# Patient Record
Sex: Male | Born: 1955 | Race: White | Hispanic: No | Marital: Married | Smoking: Never smoker
Health system: Southern US, Community
[De-identification: ages and names within clinical notes are randomized; demographics above are authoritative.]

## PROBLEM LIST (undated history)

## (undated) DIAGNOSIS — I4891 Unspecified atrial fibrillation: Secondary | ICD-10-CM

## (undated) DIAGNOSIS — I10 Essential (primary) hypertension: Secondary | ICD-10-CM

## (undated) DIAGNOSIS — E119 Type 2 diabetes mellitus without complications: Secondary | ICD-10-CM

## (undated) HISTORY — DX: Essential (primary) hypertension: I10

## (undated) HISTORY — DX: Type 2 diabetes mellitus without complications: E11.9

## (undated) HISTORY — DX: Unspecified atrial fibrillation: I48.91

---

## 1977-07-04 HISTORY — PX: OTHER SURGICAL HISTORY: SHX169

## 2012-07-05 LAB — PSA: PSA: NORMAL

## 2014-12-04 ENCOUNTER — Telehealth: Payer: Self-pay | Admitting: Internal Medicine

## 2014-12-04 NOTE — Telephone Encounter (Signed)
error 

## 2014-12-16 ENCOUNTER — Telehealth: Payer: Self-pay | Admitting: Internal Medicine

## 2014-12-16 NOTE — Telephone Encounter (Signed)
Pt scheduled for 03/26/15 3:00pm as new pt.

## 2014-12-16 NOTE — Telephone Encounter (Signed)
-----   Message from Dorette Grate, [place] sent at 12/13/2014 12:52 PM EDT ----- Regarding: FW: New Pt request Okay to schedule NP appt.   ----- Message -----    From: Wanda Plump, MD    Sent: 12/13/2014  12:49 PM      To: Dorette Grate, CMA Subject: RE: New Pt request                             That is ok JP ----- Message -----    From: Dorette Grate, CMA    Sent: 12/04/2014   1:52 PM      To: Wanda Plump, MD Subject: New Pt request                                 Please see below.   ----- Message -----    From: Maia Petties    Sent: 12/04/2014   1:42 PM      To: Dorette Grate, CMA  Pt has just moved here from [place]. He was referred to Dr. Drue Novel by an existing patient ([place], Terry W - [phone]). He states he has Media planner, currently does not have insurance thru his employer but likely will. Would Dr. Drue Novel accept him as a new patient?

## 2015-03-20 ENCOUNTER — Encounter: Payer: Self-pay | Admitting: Medical

## 2015-03-20 ENCOUNTER — Ambulatory Visit (HOSPITAL_BASED_OUTPATIENT_CLINIC_OR_DEPARTMENT_OTHER)
Admission: RE | Admit: 2015-03-20 | Discharge: 2015-03-20 | Disposition: A | Discharge status: Home / Self Care | Payer: No Typology Code available for payment source | Source: Ambulatory Visit | Attending: Medical | Admitting: Medical

## 2015-03-20 ENCOUNTER — Ambulatory Visit (INDEPENDENT_AMBULATORY_CARE_PROVIDER_SITE_OTHER): Payer: No Typology Code available for payment source | Admitting: Medical

## 2015-03-20 VITALS — BP 130/96 | HR 100 | Temp 98.0°F | Resp 16 | Ht 73.0 in | Wt 269.0 lb

## 2015-03-20 DIAGNOSIS — R030 Elevated blood-pressure reading, without diagnosis of hypertension: Secondary | ICD-10-CM

## 2015-03-20 DIAGNOSIS — I481 Persistent atrial fibrillation: Secondary | ICD-10-CM | POA: Diagnosis not present

## 2015-03-20 DIAGNOSIS — I4819 Other persistent atrial fibrillation: Secondary | ICD-10-CM

## 2015-03-20 DIAGNOSIS — J90 Pleural effusion, not elsewhere classified: Secondary | ICD-10-CM | POA: Diagnosis not present

## 2015-03-20 DIAGNOSIS — IMO0001 Reserved for inherently not codable concepts without codable children: Secondary | ICD-10-CM

## 2015-03-20 DIAGNOSIS — I4891 Unspecified atrial fibrillation: Secondary | ICD-10-CM | POA: Diagnosis not present

## 2015-03-20 DIAGNOSIS — I10 Essential (primary) hypertension: Secondary | ICD-10-CM | POA: Diagnosis present

## 2015-03-20 DIAGNOSIS — I517 Cardiomegaly: Secondary | ICD-10-CM | POA: Diagnosis not present

## 2015-03-20 MED ORDER — METOPROLOL TARTRATE 50 MG PO TABS: 50.0000 mg | ORAL_TABLET | Freq: Two times a day (BID) | ORAL | Status: DC

## 2015-03-20 NOTE — Progress Notes (Signed)
Pre visit review using our clinic review tool, if applicable. No additional management support is needed unless otherwise documented below in the visit note. 

## 2015-03-20 NOTE — Progress Notes (Signed)
Subjective:    Patient ID: Jonathon Price, male    DOB: 1956/03/07, 59 y.o.   MRN: [id number]  HPI   I have reviewed pt PMH, PSH, FH, Social History and Surgical History.  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 No exercise, Caffeinated beverage. Occasional soft drink. No alcohol, no tobacco, Married-2 children.   Atrial fibrillation- dx for 12 yrs.  Pt is only on low dose aspirin 81 mg. Pt tried warfarin in past. Vision side effects with  the medication and blood pressure elevation caused by warfarin per pt. Pt pulse is usually mid to upper 70's. Pt pulse was upper 90's yesterday. Pt former doctor was in [place]. He states that MD was cardiologist. Pt states echo done 12 years ago. At that time he states decision was only to be on aspirin. Pt states each year physical had normal labs and always had rate control.  No hx of chf, no hx of htn(except for today his bp is high), no diabetes history, and no hx of stroke or MI. Pt is 59yo. Today [place] score would be at most 1. Pt never had echo done. Nx of valve disease that he knows of. No hyperthyoid hx.   No chest pain.No sob. No leg pain.  BP has been been mid 140 systolic over mid 90 yesterday. Usually pt bp 120-130/70.   Not diabetic. No valve disease. No mi or strokes.  Pt just yesterday felt random tingle sensation. Sweaty for about 10 minutes.   Pt has had normal physical each yr per his report.   Pt pulse with pulse/ox monitor varies between 130-150. On ekg rate 135.      Review of Systems   Constitutional: Negative for fever, chills, diaphoresis, activity change and fatigue.  Respiratory: Negative for cough, chest tightness and shortness of breath.   Cardiovascular: Negative for chest pain, palpitations and leg swelling.  Gastrointestinal: Negative for nausea, vomiting and abdominal pain.  Musculoskeletal: Negative for neck pain and neck stiffness.  Neurological: Negative for dizziness,  tremors, seizures, syncope, facial asymmetry, speech difficulty, weakness, light-headedness, numbness and headaches.  Psychiatric/Behavioral: Negative for behavioral problems, confusion and agitation. The patient is not nervous/anxious.       History reviewed. No pertinent past medical history.  Social History   Social History  . Marital Status: Married    Spouse Name: N/A  . Number of Children: N/A  . Years of Education: N/A   Occupational History  . Not on file.   Social History Main Topics  . Smoking status: Never Smoker   . Smokeless tobacco: Never Used  . Alcohol Use: No  . Drug Use: No  . Sexual Activity: Not on file   Other Topics Concern  . Not on file   Social History Narrative  . No narrative on file    Past Surgical History  Procedure Laterality Date  . Right ankle operation  12 31 1978    Family History  Problem Relation Age of Onset  . Cancer Mother     Not on File  No current outpatient prescriptions on file prior to visit.   No current facility-administered medications on file prior to visit.    BP 130/96 mmHg  Pulse 100  Temp(Src) 98 F (36.7 C) (Oral)  Resp 16  Ht 6\' 1"  (1.854 m)  Wt 269 lb (122.018 kg)  BMI 35.50 kg/m2  SpO2 98%       Objective:   Physical Exam  General Mental Status- Alert.  General Appearance- Not in acute distress.   Skin General: Color- Normal Color. Moisture- Normal Moisture.  Neck Carotid Arteries- Normal color. Moisture- Normal Moisture. No carotid bruits. No JVD.  Chest and Lung Exam Auscultation: Breath Sounds:-Normal. CTA  Cardiovascular Auscultation:Rythm- irregular.  Murmurs & Other Heart Sounds:Auscultation of the heart reveals- No Murmurs.  Abdomen Inspection:-Inspeection Normal. Palpation/Percussion:Note:No mass. Palpation and Percussion of the abdomen reveal- Non Tender, Non Distended + BS, no rebound or guarding.    Neurologic Cranial Nerve exam:- CN III-XII intact(No  nystagmus), symmetric smile. Romberg Exam:- Negative.  Finger to Nose:- Normal/Intact Strength:- 5/5 equal and symmetric strength both upper and lower extremities.  Lower ext- no pedal edema. Negative homans signs.,      Assessment & Plan:  On ekg showed atrial fibrillation. Rate 135. I did talk with Dr. Theodoro Clock cardiologist  on call. Will give pt metoprolol 50 mg bid. Refer him to cardiology. When we make appointment will see if cardiologist office wants [place] to order echo or their office can do same day visit.  Will go ahead and schedule appointment with cardiologist.   Did also decide to get cxr today.  Follow up with Dr. Drue Novel next week.  Would recommend also scheduling for complete physical in near future.  If pt gets symptomatic then advised to go to ED.

## 2015-03-20 NOTE — Patient Instructions (Addendum)
I did talk with Dr. Theodoro Clock cardiologist  on call. Will give pt metoprolol 50 mg bid. Refer him to cardiology. When we make appointment will see if cardiologist office wants [place] to order echo or their office can do same day visit.  Will go ahead and schedule appointment with cardiologist.   Did also decide to get cxr today.  Follow up with Dr. Drue Novel next week.  Would recommend also scheduling for complete physical in near future.  If pt gets symptomatic then advised to go to ED.

## 2015-03-24 ENCOUNTER — Ambulatory Visit (HOSPITAL_COMMUNITY)
Admission: RE | Admit: 2015-03-24 | Discharge: 2015-03-24 | Disposition: A | Discharge status: Home / Self Care | Payer: No Typology Code available for payment source | Source: Ambulatory Visit | Attending: Nurse Practitioner | Admitting: Nurse Practitioner

## 2015-03-24 VITALS — BP 152/82 | HR 101 | Ht 73.0 in | Wt 271.2 lb

## 2015-03-24 DIAGNOSIS — Z79899 Other long term (current) drug therapy: Secondary | ICD-10-CM | POA: Insufficient documentation

## 2015-03-24 DIAGNOSIS — I4819 Other persistent atrial fibrillation: Secondary | ICD-10-CM

## 2015-03-24 DIAGNOSIS — Z7982 Long term (current) use of aspirin: Secondary | ICD-10-CM | POA: Insufficient documentation

## 2015-03-24 DIAGNOSIS — I4891 Unspecified atrial fibrillation: Secondary | ICD-10-CM | POA: Diagnosis present

## 2015-03-24 DIAGNOSIS — R0683 Snoring: Secondary | ICD-10-CM | POA: Insufficient documentation

## 2015-03-24 DIAGNOSIS — I481 Persistent atrial fibrillation: Secondary | ICD-10-CM | POA: Diagnosis not present

## 2015-03-24 MED ORDER — DILTIAZEM HCL ER COATED BEADS 180 MG PO CP24: 180.0000 mg | ORAL_CAPSULE | Freq: Every day | ORAL | Status: DC

## 2015-03-24 NOTE — Patient Instructions (Signed)
Your physician has recommended you make the following change in your medication:  1)Stop metoprolol 2)Start cardizem  once a day  Jonathon Price will call to schedule echo  Scheduler will call regarding scheduling sleep study  Parking code [phone]

## 2015-03-25 ENCOUNTER — Ambulatory Visit (HOSPITAL_COMMUNITY)
Admission: RE | Admit: 2015-03-25 | Discharge: 2015-03-25 | Disposition: A | Discharge status: Home / Self Care | Payer: No Typology Code available for payment source | Source: Ambulatory Visit | Attending: Nurse Practitioner | Admitting: Nurse Practitioner

## 2015-03-25 ENCOUNTER — Telehealth: Payer: Self-pay | Admitting: Behavioral Health

## 2015-03-25 ENCOUNTER — Encounter: Payer: Self-pay | Admitting: Behavioral Health

## 2015-03-25 DIAGNOSIS — I351 Nonrheumatic aortic (valve) insufficiency: Secondary | ICD-10-CM | POA: Diagnosis not present

## 2015-03-25 DIAGNOSIS — I4819 Other persistent atrial fibrillation: Secondary | ICD-10-CM

## 2015-03-25 DIAGNOSIS — I4891 Unspecified atrial fibrillation: Secondary | ICD-10-CM | POA: Insufficient documentation

## 2015-03-25 DIAGNOSIS — I340 Nonrheumatic mitral (valve) insufficiency: Secondary | ICD-10-CM | POA: Insufficient documentation

## 2015-03-25 DIAGNOSIS — I481 Persistent atrial fibrillation: Secondary | ICD-10-CM | POA: Diagnosis not present

## 2015-03-25 DIAGNOSIS — I517 Cardiomegaly: Secondary | ICD-10-CM | POA: Insufficient documentation

## 2015-03-25 NOTE — Progress Notes (Signed)
  Echocardiogram 2D Echocardiogram has been performed.  Jonathon Price 03/25/2015, 9:55 AM

## 2015-03-25 NOTE — Telephone Encounter (Signed)
Pre-Visit Call completed with patient and chart updated.   Pre-Visit Info documented in Specialty Comments under [employer].    

## 2015-03-25 NOTE — Progress Notes (Signed)
Patient ID: Jonathon Price, male   DOB: [birth year], 59 y.o.   MRN: [id number]     Primary Care Physician: Willow Ora, MD Referring Physician:same   Jonathon Price is a 59 y.o. male with a h/o PAF x 12 years that recently has been found to be persistent being evaluated in the afib clinic. He was first dx in [place] and was followed by a cardiologist there. He states he was on coumadin at one time but stopped due to side effects. He currently has a chadsvasc score of 0 and is on a baby asa. He started having some elevation in BP and sought medical attention and was found to be in afib. In the office today, he is not aware of any change of being in afib, other than the fluctuations in BP. Denies fatigue, shortness of breath, palpitations. He currently is out of work but actively looking for a job.  He denies any alcohol use, caffeine use. He does snore with apneic spells per wife but pt denies symptoms of sleep apnea. He does not exercise and is overweight. He was placed on  metoprolol a few days ago for rate control, HR now 101 down from 135 bpm, but would like to change drug due to excessive gas.  Today, he denies symptoms of palpitations, chest pain, shortness of breath, orthopnea, PND, lower extremity edema, dizziness, presyncope, syncope, or neurologic sequela. The patient is tolerating medications without difficulties and is otherwise without complaint today.   No past medical history on file. Past Surgical History  Procedure Laterality Date  . Right ankle operation  12 31 1978    Current Outpatient Prescriptions  Medication Sig Dispense Refill  . aspirin 81 MG tablet Take 81 mg by mouth daily.    Marland Kitchen diltiazem (CARDIZEM CD) 180 MG 24 hr capsule Take 1 capsule (180 mg total) by mouth daily. 30 capsule 3   No current facility-administered medications for this encounter.    No Known Allergies  Social History   Social History  . Marital Status: Married    Spouse Name: N/A  .  Number of Children: N/A  . Years of Education: N/A   Occupational History  . Not on file.   Social History Main Topics  . Smoking status: Never Smoker   . Smokeless tobacco: Never Used  . Alcohol Use: No  . Drug Use: No  . Sexual Activity: Yes   Other Topics Concern  . Not on file   Social History Narrative  . No narrative on file    Family History  Problem Relation Age of Onset  . Cancer Mother     ROS- All systems are reviewed and negative except as per the HPI above  Physical Exam: Filed Vitals:   03/24/15 0844  BP: 152/82  Pulse: 101  Height:  (1.854 m)  Weight: 271 lb 3.2 oz (123.016 kg)    GEN- The patient is well appearing, alert and oriented x 3 today.   Head- normocephalic, atraumatic Eyes-  Sclera clear, conjunctiva pink Ears- hearing intact Oropharynx- clear Neck- supple, no JVP Lymph- no cervical lymphadenopathy Lungs- Clear to ausculation bilaterally, normal work of breathing Heart- Irregular rate and rhythm, no murmurs, rubs or gallops, PMI not laterally displaced GI- soft, NT, ND, + BS Extremities- no clubbing, cyanosis, or edema MS- no significant deformity or atrophy Skin- no rash or lesion Psych- euthymic mood, full affect Neuro- strength and sensation are intact  EKG- Afib with rvr at 1010 bpm,  QRS int 90 ms, QTc 433 ms  Assessment and Plan: 1. Afib Sounds like may have been PAF in past but now has asymptomatic persistent afib Will change metoprolol due to intolerance and start cardizem  a day Continue asa for now with chadsvasc score of 0-1( recent elevation of BP, no previous dx) Pt understands if we pursue SR he may have to be on an anticoagulant short term Schedule echo  2. Snoring Schedule sleep study  3. Lifestyle factors Weight loss/regular exercise encouraged  F/u 1-2 weeks for effect of cardizem on rate control/BP  Donna C. [facility] [address] [place], [place]  [address] [phone]

## 2015-03-26 ENCOUNTER — Ambulatory Visit (INDEPENDENT_AMBULATORY_CARE_PROVIDER_SITE_OTHER): Payer: No Typology Code available for payment source | Admitting: Internal Medicine

## 2015-03-26 ENCOUNTER — Encounter: Payer: Self-pay | Admitting: Internal Medicine

## 2015-03-26 VITALS — BP 142/84 | HR 64 | Temp 98.1°F | Ht 73.0 in | Wt 273.1 lb

## 2015-03-26 DIAGNOSIS — I4819 Other persistent atrial fibrillation: Secondary | ICD-10-CM

## 2015-03-26 DIAGNOSIS — Z09 Encounter for follow-up examination after completed treatment for conditions other than malignant neoplasm: Secondary | ICD-10-CM

## 2015-03-26 DIAGNOSIS — I481 Persistent atrial fibrillation: Secondary | ICD-10-CM

## 2015-03-26 DIAGNOSIS — IMO0001 Reserved for inherently not codable concepts without codable children: Secondary | ICD-10-CM

## 2015-03-26 DIAGNOSIS — R030 Elevated blood-pressure reading, without diagnosis of hypertension: Secondary | ICD-10-CM | POA: Diagnosis not present

## 2015-03-26 NOTE — Progress Notes (Signed)
Subjective:    Patient ID: Jonathon Price, male    DOB: [birth year], 59 y.o.   MRN: [id number]  DOS:  03/26/2015 Type of visit - description : New patient to me, to get established Interval history: Atrial fibrillation: Just saw cardiology had an echocardiogram. He was intolerant to beta blockers, now on CCBs and feeling well. Elevated BP: Has check his BP for years, has always been in the 120/70, in the last 2 weeks it has been ~ 140/85  . Snoring: Has always been an issue, cardiology is already referring him for a sleep study   Review of Systems Denies chest pain, difficulty breathing. No palpitations. No  nausea, vomiting, diarrhea or blood in the stools. No anxiety- depression.   Past Medical History  Diagnosis Date  . Hypertension   . A-fib     Past Surgical History  Procedure Laterality Date  . Right ankle operation  12 31 1978    Social History   Social History  . Marital Status: Married    Spouse Name: N/A  . Number of Children: 2  . Years of Education: N/A   Occupational History  . Emergency planning/management officer     Social History Main Topics  . Smoking status: Never Smoker   . Smokeless tobacco: Never Used  . Alcohol Use: No  . Drug Use: No  . Sexual Activity: Yes   Other Topics Concern  . Not on file   Social History Narrative        Medication List       This list is accurate as of: 03/26/15 11:59 PM.  Always use your most recent med list.               aspirin 81 MG tablet  Take 81 mg by mouth daily.     diltiazem 180 MG 24 hr capsule  Commonly known as:  CARDIZEM CD  Take 1 capsule (180 mg total) by mouth daily.           Objective:   Physical Exam BP 142/84 mmHg  Pulse 64  Temp(Src) 98.1 F (36.7 C) (Oral)  Ht  (1.854 m)  Wt 273 lb 2 oz (123.889 kg)  BMI 36.04 kg/m2  SpO2 97% General:   Well developed, well nourished . NAD.  HEENT:  Normocephalic . Face symmetric, atraumatic Lungs:  CTA B Normal respiratory effort, no  intercostal retractions, no accessory muscle use. Heart: Irregular,  no murmur.  Right calf: Is 1.5 inches larger in circumference compared to the left. No TTP, not read Abdomen:  Not distended, soft, non-tender. No rebound or rigidity. No mass,organomegaly Skin: Not pale. Not jaundice Neurologic:  alert & oriented X3.  Speech normal, gait appropriate for age and unassisted Psych--  Cognition and judgment appear intact.  Cooperative with normal attention span and concentration.  Behavior appropriate. No anxious or depressed appearing.    Assessment & Plan:   Assessment > Paroxsymal Atrial fibrillation dx ~ 2004 in [place], intolerant to Coumadin (in 2004), intolerant to metoprolol (03-2015), on ASA Snoring, Rx sleep study 03-2015 per cards  Elevated BP w/o Dx of HTN Chronic leg swelling (R) since surgery 1978 by~ 1.5 inches  Plan  Chronic atrial fibrillation, managed by cardiology, on calcium channel blockers, vital signs today is stable. Will check a CMP, CBC, FLP and TSH. Elevated BP without a diagnosis of high blood pressure: BP has been satisfactory for years, here lately has been in the 140s over 80s. Chronic R  lower extremity edema, stable for years. Primary care: strongly declined a flu shot ("make me sick") and a pnm shot

## 2015-03-26 NOTE — Progress Notes (Signed)
Pre visit review using our clinic review tool, if applicable. No additional management support is needed unless otherwise documented below in the visit note. 

## 2015-03-26 NOTE — Patient Instructions (Signed)
  Please schedule labs to be done tomorrow (fasting)      Next visit  for a   physical exam in 6 months   Please schedule an appointment at the front desk

## 2015-03-27 ENCOUNTER — Other Ambulatory Visit: Payer: No Typology Code available for payment source

## 2015-03-27 DIAGNOSIS — Z09 Encounter for follow-up examination after completed treatment for conditions other than malignant neoplasm: Secondary | ICD-10-CM | POA: Insufficient documentation

## 2015-03-27 NOTE — Assessment & Plan Note (Signed)
Chronic atrial fibrillation, managed by cardiology, on calcium channel blockers, vital signs today is stable. Will check a CMP, CBC, FLP and TSH. Elevated BP without a diagnosis of high blood pressure: BP has been satisfactory for years, here lately has been in the 140s over 80s. Chronic R lower extremity edema, stable for years. Primary care: strongly declined a flu shot ("make me sick") and a pnm shot

## 2015-03-28 ENCOUNTER — Other Ambulatory Visit (INDEPENDENT_AMBULATORY_CARE_PROVIDER_SITE_OTHER): Payer: No Typology Code available for payment source

## 2015-03-28 ENCOUNTER — Ambulatory Visit (HOSPITAL_COMMUNITY)
Admission: RE | Admit: 2015-03-28 | Discharge: 2015-03-28 | Disposition: A | Discharge status: Home / Self Care | Payer: No Typology Code available for payment source | Source: Ambulatory Visit | Attending: Nurse Practitioner | Admitting: Nurse Practitioner

## 2015-03-28 VITALS — BP 130/70 | HR 94 | Ht 73.0 in | Wt 272.2 lb

## 2015-03-28 DIAGNOSIS — I4819 Other persistent atrial fibrillation: Secondary | ICD-10-CM

## 2015-03-28 DIAGNOSIS — I481 Persistent atrial fibrillation: Secondary | ICD-10-CM

## 2015-03-28 DIAGNOSIS — R0683 Snoring: Secondary | ICD-10-CM | POA: Insufficient documentation

## 2015-03-28 DIAGNOSIS — I10 Essential (primary) hypertension: Secondary | ICD-10-CM

## 2015-03-28 LAB — COMPREHENSIVE METABOLIC PANEL
ALBUMIN: 4 g/dL (ref 3.5–5.2)
ALK PHOS: 50 U/L (ref 39–117)
ALT: 35 U/L (ref 0–53)
AST: 20 U/L (ref 0–37)
BUN: 22 mg/dL (ref 6–23)
CO2: 27 mEq/L (ref 19–32)
CREATININE: 1.22 mg/dL (ref 0.40–1.50)
Calcium: 9.3 mg/dL (ref 8.4–10.5)
Chloride: 106 mEq/L (ref 96–112)
GFR: 64.12 mL/min (ref >60.00)
Glucose, Bld: 100 mg/dL — ABNORMAL HIGH (ref 70–99)
POTASSIUM: 5 meq/L (ref 3.5–5.1)
SODIUM: 141 meq/L (ref 135–145)
TOTAL PROTEIN: 6.9 g/dL (ref 6.0–8.3)
Total Bilirubin: 0.9 mg/dL (ref 0.2–1.2)

## 2015-03-28 LAB — CBC WITH DIFFERENTIAL/PLATELET
BASOS PCT: 0.4 % (ref 0.0–3.0)
Basophils Absolute: 0 10*3/uL (ref 0.0–0.1)
EOS PCT: 4.3 % (ref 0.0–5.0)
Eosinophils Absolute: 0.3 10*3/uL (ref 0.0–0.7)
HEMATOCRIT: 47.5 % (ref 39.0–52.0)
HEMOGLOBIN: 15.6 g/dL (ref 13.0–17.0)
LYMPHS PCT: 28.3 % (ref 12.0–46.0)
Lymphs Abs: 2.2 10*3/uL (ref 0.7–4.0)
MCHC: 33 g/dL (ref 30.0–36.0)
MCV: 88.3 fl (ref 78.0–100.0)
MONOS PCT: 6.6 % (ref 3.0–12.0)
Monocytes Absolute: 0.5 10*3/uL (ref 0.1–1.0)
Neutro Abs: 4.8 10*3/uL (ref 1.4–7.7)
Neutrophils Relative %: 60.4 % (ref 43.0–77.0)
Platelets: 193 10*3/uL (ref 150.0–400.0)
RBC: 5.38 Mil/uL (ref 4.22–5.81)
RDW: 16.7 % — AB (ref 11.5–15.5)
WBC: 7.9 10*3/uL (ref 4.0–10.5)

## 2015-03-28 LAB — LIPID PANEL
CHOLESTEROL: 169 mg/dL (ref 0–200)
HDL: 43.5 mg/dL (ref >39.00)
LDL Cholesterol: 108 mg/dL — ABNORMAL HIGH (ref 0–99)
NonHDL: 125.34
Total CHOL/HDL Ratio: 4
Triglycerides: 88 mg/dL (ref 0.0–149.0)
VLDL: 17.6 mg/dL (ref 0.0–40.0)

## 2015-03-28 LAB — TSH: TSH: 1.36 u[IU]/mL (ref 0.35–4.50)

## 2015-03-28 NOTE — Progress Notes (Signed)
Patient ID: Jonathon Price, male   DOB: Feb 09, 1956, 59 y.o.   MRN: [id number]     Primary Care Physician: Willow Ora, MD Referring Physician:same Cardiologist: None   Jonathon Price is a 59 y.o. male with a h/o PAF x 12 years being evaluated in the afib clinic. He was first dx in [place] and was followed by a cardiologist there. He states he was on coumadin at one time but stopped due to side effects. He currently has a chadsvasc score of 0 and is on a baby asa. He started having some elevation in BP and sought medical attention and was found to be in afib. In the office today, he is not aware of any change of being in afib, other than the fluctuations in BP. Denies fatigue, shortness of breath, palpitations. He currently is out of work but actively looking for a job.  He denies any alcohol use, caffeine use. He does snore with apneic spells per wife but pt denies symptoms of sleep apnea. He does not exercise and is overweight. He was placed on  metoprolol a few days ago for rate control, HR now 101 down from 135 bpm, but would like to change drug due to excessive gas.   On last visit in the afib clinic, he was started on cardizem and metoprolol stopped. He is tolerating the cardizem better. V rate in the office 94 bpm, but states everytime he has checked it at home it is in the  70's. Again , he is asymptomatic. States he feels great. Echo was done and showed EF of 40-45%, Mod regurg of Aortic and mitral vavle. However, pt states no previous echo so I do not know if reduced EF is new or chronic. Sleep study is pending.  Today, he denies symptoms of palpitations, chest pain, shortness of breath, orthopnea, PND, lower extremity edema, dizziness, presyncope, syncope, or neurologic sequela. The patient is tolerating medications without difficulties and is otherwise without complaint today.   Past Medical History  Diagnosis Date  . Hypertension   . A-fib    Past Surgical History  Procedure  Laterality Date  . Right ankle operation  12 31 1978    Current Outpatient Prescriptions  Medication Sig Dispense Refill  . aspirin 81 MG tablet Take 81 mg by mouth daily.    Marland Kitchen diltiazem (CARDIZEM CD) 180 MG 24 hr capsule Take 1 capsule (180 mg total) by mouth daily. 30 capsule 3   No current facility-administered medications for this encounter.    No Known Allergies  Social History   Social History  . Marital Status: Married    Spouse Name: N/A  . Number of Children: 2  . Years of Education: N/A   Occupational History  . Emergency planning/management officer     Social History Main Topics  . Smoking status: Never Smoker   . Smokeless tobacco: Never Used  . Alcohol Use: No  . Drug Use: No  . Sexual Activity: Yes   Other Topics Concern  . Not on file   Social History Narrative    Family History  Problem Relation Age of Onset  . Breast cancer Mother   . CAD Neg Hx   . Atrial fibrillation Neg Hx   . Colon cancer Neg Hx   . Prostatitis Neg Hx   . Diabetes Neg Hx     ROS- All systems are reviewed and negative except as per the HPI above  Physical Exam: Filed Vitals:   03/28/15 4098  BP: 130/70  Pulse: 94  Height:  (1.854 m)  Weight: 272 lb 3.2 oz (123.469 kg)    GEN- The patient is well appearing, alert and oriented x 3 today.   Head- normocephalic, atraumatic Eyes-  Sclera clear, conjunctiva pink Ears- hearing intact Oropharynx- clear Neck- supple, no JVP Lymph- no cervical lymphadenopathy Lungs- Clear to ausculation bilaterally, normal work of breathing Heart- Irregular rate and rhythm, no murmurs, rubs or gallops, PMI not laterally displaced GI- soft, NT, ND, + BS Extremities- no clubbing, cyanosis, or edema MS- no significant deformity or atrophy Skin- no rash or lesion Psych- euthymic mood, full affect Neuro- strength and sensation are intact  EKG- Afib with rvr at 94 bpm, QRS int 94 ms, QTc 465 ms  Assessment and Plan: 1. Asymptomatic persistent   Afib Sounds like may have been paroxysmal in the past but I don't  know when he became persistent   Continue cardizem  a day Continue asa for now with chadsvasc score of 0-1( recent elevation of BP, no previous dx) Pt understands if we pursue SR he may have to be on an anticoagulant short term  2. Snoring  sleep study scheduled  3. Lifestyle factors Weight loss/regular exercise encouraged   I will further discuss with Dr. Johney Frame to see if with reduced ef and pt asymptomatic, if preferable to try to restore sinus rhythm or continue to rate control.  F/u depends on above conversation  Lupita Leash C[facility] [address] [place], [place] [address] [phone]

## 2015-03-30 ENCOUNTER — Encounter: Payer: Self-pay | Admitting: Internal Medicine

## 2015-03-31 ENCOUNTER — Other Ambulatory Visit: Payer: Self-pay | Admitting: *Deleted

## 2015-03-31 ENCOUNTER — Telehealth (HOSPITAL_COMMUNITY): Payer: Self-pay | Admitting: Nurse Practitioner

## 2015-03-31 DIAGNOSIS — G4733 Obstructive sleep apnea (adult) (pediatric): Secondary | ICD-10-CM

## 2015-03-31 DIAGNOSIS — I4891 Unspecified atrial fibrillation: Secondary | ICD-10-CM

## 2015-03-31 NOTE — Telephone Encounter (Signed)
Discussed case with Dr. Johney Frame and informed pt of discussion. It is suggested that pt start on ace/carvedilol, stop cardizem for reduced ef. He now has a chadsvasc score of 1-2( borderline bp/reduced af) and Dr. Johney Frame believes would be best if he starts blood thinner.He also thinks since he is asymptomatic that rate control would be best for now. He may need a stress test as well. He will need f/u with general cardiology. The pt in out of town for two weeks and will call and schedule appointment when he returns to town for above to start.

## 2015-04-01 ENCOUNTER — Ambulatory Visit (HOSPITAL_COMMUNITY): Payer: No Typology Code available for payment source | Admitting: Nurse Practitioner

## 2015-06-20 ENCOUNTER — Ambulatory Visit (HOSPITAL_BASED_OUTPATIENT_CLINIC_OR_DEPARTMENT_OTHER): Payer: No Typology Code available for payment source

## 2015-07-02 ENCOUNTER — Ambulatory Visit: Payer: No Typology Code available for payment source | Admitting: Cardiovascular Disease

## 2015-07-18 ENCOUNTER — Ambulatory Visit (INDEPENDENT_AMBULATORY_CARE_PROVIDER_SITE_OTHER): Payer: BLUE CROSS/BLUE SHIELD | Admitting: Cardiovascular Disease

## 2015-07-18 ENCOUNTER — Encounter: Payer: Self-pay | Admitting: Cardiovascular Disease

## 2015-07-18 VITALS — BP 122/80 | HR 113 | Ht 74.0 in | Wt 266.8 lb

## 2015-07-18 DIAGNOSIS — I481 Persistent atrial fibrillation: Secondary | ICD-10-CM | POA: Diagnosis not present

## 2015-07-18 DIAGNOSIS — Z7689 Persons encountering health services in other specified circumstances: Secondary | ICD-10-CM

## 2015-07-18 DIAGNOSIS — Z79899 Other long term (current) drug therapy: Secondary | ICD-10-CM | POA: Diagnosis not present

## 2015-07-18 DIAGNOSIS — I4819 Other persistent atrial fibrillation: Secondary | ICD-10-CM

## 2015-07-18 DIAGNOSIS — Z7189 Other specified counseling: Secondary | ICD-10-CM

## 2015-07-18 LAB — CBC WITH DIFFERENTIAL/PLATELET
BASOS ABS: 0.1 10*3/uL (ref 0.0–0.1)
BASOS PCT: 1 % (ref 0–1)
EOS ABS: 0.3 10*3/uL (ref 0.0–0.7)
EOS PCT: 3 % (ref 0–5)
HCT: 49.1 % (ref 39.0–52.0)
Hemoglobin: 17 g/dL (ref 13.0–17.0)
Lymphocytes Relative: 26 % (ref 12–46)
Lymphs Abs: 2.7 10*3/uL (ref 0.7–4.0)
MCH: 30.4 pg (ref 26.0–34.0)
MCHC: 34.6 g/dL (ref 30.0–36.0)
MCV: 87.8 fL (ref 78.0–100.0)
MPV: 9.6 fL (ref 8.6–12.4)
Monocytes Absolute: 0.8 10*3/uL (ref 0.1–1.0)
Monocytes Relative: 8 % (ref 3–12)
Neutro Abs: 6.5 10*3/uL (ref 1.7–7.7)
Neutrophils Relative %: 62 % (ref 43–77)
PLATELETS: 227 10*3/uL (ref 150–400)
RBC: 5.59 MIL/uL (ref 4.22–5.81)
RDW: 15.1 % (ref 11.5–15.5)
WBC: 10.5 10*3/uL (ref 4.0–10.5)

## 2015-07-18 MED ORDER — DILTIAZEM HCL ER COATED BEADS 180 MG PO CP24
180.0000 mg | ORAL_CAPSULE | Freq: Every day | ORAL | Status: DC
Start: 2015-07-18 — End: 2015-10-23

## 2015-07-18 MED ORDER — RIVAROXABAN 20 MG PO TABS: 20.0000 mg | ORAL_TABLET | Freq: Every day | ORAL | Status: DC

## 2015-07-18 NOTE — Progress Notes (Signed)
Patient ID: Jonathon Price, male   DOB: 1955-10-07, 60 y.o.   MRN: [id number]     Primary Care Physician: Willow OraJose Paz, MD Referring Physician:same Cardiologist: None   Jonathon Price is a 60 y.o. male with a h/o PAF x 12 years Seen in the Afib clinic September . [place] was followed by a cardiologist there. He states he was on coumadin at one time but stopped due to side effects. He currently has a chadsvasc score of 0 and is on a baby asa. He started having some elevation in BP and sought medical attention and was found to be in afib. In the office today, he is not aware of any change of being in afib, other than the fluctuations in BP. Denies fatigue, shortness of breath, palpitations. He currently is out of work but actively looking for a job.  He denies any alcohol use, caffeine use. He does snore with apneic spells per wife but pt denies symptoms of sleep apnea. He does not exercise and is overweight. He was placed on  metoprolol a few days ago for rate control, HR now 101 down from 135 bpm, but would like to change drug due to excessive gas.   He was started on cardizem.  There was some discussion about cardioversion but nothing was ever followed through with Sebastian Acheonna Carrol and Dr Johney FrameAllred I reviewed his echo and his EF is down at 40-45% and he only has mild LAE.  Given this I feel he should have attempt at conversion. He has some issues wit epistaxis usually in April related to sinus issues but no other bleeding issues  He prefers to be on NOAC.  And is willing to proceed   Echo 03/25/15  Study Conclusions  - Left ventricle: Wall thickness was increased in a pattern of mild LVH. Systolic function was mildly to moderately reduced. The estimated ejection fraction was in the range of 40% to 45%. - Aortic valve: There was moderate regurgitation. - Mitral valve: There was moderate regurgitation. - Left atrium: The atrium was mildly dilated. - Right atrium:  The atrium was moderately dilated. - Pulmonary arteries: Systolic pressure was mildly increased. PA peak pressure: 32 mm Hg (S).   Past Medical History  Diagnosis Date  . Hypertension   . A-fib [facility](HCC)    Past Surgical History  Procedure Laterality Date  . Right ankle operation  12 31 1978    Current Outpatient Prescriptions  Medication Sig Dispense Refill  . aspirin 81 MG tablet Take 81 mg by mouth daily.    Marland Kitchen. diltiazem (CARDIZEM CD) 180 MG 24 hr capsule Take 1 capsule (180 mg total) by mouth daily. 30 capsule 3   No current facility-administered medications for this visit.    No Known Allergies  Social History   Social History  . Marital Status: Married    Spouse Name: N/A  . Number of Children: 2  . Years of Education: N/A   Occupational History  . Emergency planning/management officerproject manager     Social History Main Topics  . Smoking status: Never Smoker   . Smokeless tobacco: Never Used  . Alcohol Use: No  . Drug Use: No  . Sexual Activity: Yes   Other Topics Concern  . Not on file   Social History Narrative    Family History  Problem Relation Age of Onset  . Breast cancer Mother   . CAD Neg Hx   . Atrial fibrillation Neg Hx   .  Colon cancer Neg Hx   . Prostatitis Neg Hx   . Diabetes Neg Hx     ROS- All systems are reviewed and negative except as per the HPI above  Physical Exam: Filed Vitals:   07/18/15 1558  BP: 122/80  Pulse: 113  Height: 6\' 2"  (1.88 m)  Weight: 121.02 kg (266 lb 12.8 oz)    GEN- The patient is well appearing, alert and oriented x 3 today.   Head- normocephalic, atraumatic Eyes-  Sclera clear, conjunctiva pink Ears- hearing intact Oropharynx- clear Neck- supple, no JVP Lymph- no cervical lymphadenopathy Lungs- Clear to ausculation bilaterally, normal work of breathing Heart- Irregular rate and rhythm,  MR cannot hear AR  murmurs, rubs or gallops, PMI not laterally displaced GI- soft, NT, ND, + BS Extremities- no clubbing, cyanosis, or  edema MS- no significant deformity or atrophy Skin- no rash or lesion Psych- euthymic mood, full affect Neuro- strength and sensation are intact  EKG- Afib with rvr at 94 bpm, QRS int 94 ms, QTc 465 ms  Assessment and Plan: 1. Afib:  Persistent with decreased EF on echo and only mild LAE.  Start Xarelto 20 mg continue cardizem  [facility] in 4 weeks Risks discussed willing to proceed BMET CBC PLT today  Scheduled for 2/24  Stop ASA   2. Snoring  sleep study scheduled  3. Lifestyle factors Weight loss/regular exercise encouraged  4. HTN continue cardizem    5. AR/MR:  Will reassess post [facility]    [employer]

## 2015-07-18 NOTE — Patient Instructions (Addendum)
Medication Instructions:  Your physician recommends that you continue on your current medications as directed. Please refer to the Current Medication list given to you today.  Labwork: Your physician recommends that you have labs today  CBC  Your physician recommends that you return for lab work in: 5 weeks for Cardioversion BMET, CBC, INR/PT  Testing/Procedures: Your physician has requested that you have Cardioversion.  Follow-Up: Your physician wants you to follow-up as already scheduled.  If you need a refill on your cardiac medications before your next appointment, please call your pharmacy.

## 2015-08-11 ENCOUNTER — Telehealth: Payer: Self-pay | Admitting: Cardiovascular Disease

## 2015-08-11 NOTE — Telephone Encounter (Signed)
Jonathon Price is calling to get the procedure reschedule , Please call..  Thanks

## 2015-08-11 NOTE — Telephone Encounter (Signed)
Patient's spouse calling letting our office know patient might need to reschedule for cardioversion, but at this time keep as scheduled. Patient will call back if change is needed.

## 2015-08-20 NOTE — Progress Notes (Signed)
Patient ID: MISHAWN HEMANN, male   DOB: 1955/07/30, 60 y.o.   MRN: [id number]   Primary Care Physician: Willow Ora, MD Referring Physician:same Cardiologist: Jonathon Price is a 60 y.o. male with a h/o PAF x 12 years Seen in the Afib clinic September . He was first dx in [place] and was followed by a cardiologist there. He states he was on coumadin at one time but stopped due to side effects. He currently has a chadsvasc score of 0 and is on a baby asa. He started having some elevation in BP and sought medical attention and was found to be in afib. In the office today, he is not aware of any change of being in afib, other than the fluctuations in BP. Denies fatigue, shortness of breath, palpitations. He currently is out of work but actively looking for a job.  He denies any alcohol use, caffeine use. He does snore with apneic spells per wife but pt denies symptoms of sleep apnea. He does not exercise and is overweight. He was placed on  metoprolol a few days ago for rate control, HR now 101 down from 135 bpm, but would like to change drug due to excessive gas.   He was started on cardizem.  There was some discussion about cardioversion but nothing was ever followed through with Sebastian Ache and Dr Johney Frame I reviewed his echo and his EF is down at 40-45% and he only has mild LAE.  Given this I feel he should have attempt at conversion. He has some issues wit epistaxis usually in April related to sinus issues but no other bleeding issues  He prefers to be on NOAC.  And is willing to proceed   Echo 03/25/15  Study Conclusions  - Left ventricle: Wall thickness was increased in a pattern of mild LVH. Systolic function was mildly to moderately reduced. The estimated ejection fraction was in the range of 40% to 45%. - Aortic valve: There was moderate regurgitation. - Mitral valve: There was moderate regurgitation. - Left atrium: The atrium was mildly dilated. - Right atrium: The  atrium was moderately dilated. - Pulmonary arteries: Systolic pressure was mildly increased. PA peak pressure: 32 mm Hg (S).   Past Medical History  Diagnosis Date  . Hypertension   . A-fib [facility])    Past Surgical History  Procedure Laterality Date  . Right ankle operation  12 31 1978    Current Outpatient Prescriptions  Medication Sig Dispense Refill  . diltiazem (CARDIZEM CD) 180 MG 24 hr capsule Take 1 capsule (180 mg total) by mouth daily. 90 capsule 3  . diphenhydrAMINE (BENADRYL) 25 MG tablet Take 25 mg by mouth 3 (three) times daily as needed for allergies.    . rivaroxaban (XARELTO) 20 MG TABS tablet Take 1 tablet (20 mg total) by mouth daily with supper. 90 tablet 3   No current facility-administered medications for this visit.    No Known Allergies  Social History   Social History  . Marital Status: Married    Spouse Name: N/A  . Number of Children: 2  . Years of Education: N/A   Occupational History  . Emergency planning/management officer     Social History Main Topics  . Smoking status: Never Smoker   . Smokeless tobacco: Never Used  . Alcohol Use: No  . Drug Use: No  . Sexual Activity: Yes   Other Topics Concern  . Not on file   Social History Narrative  Family History  Problem Relation Age of Onset  . Breast cancer Mother   . CAD Neg Hx   . Atrial fibrillation Neg Hx   . Colon cancer Neg Hx   . Prostatitis Neg Hx   . Diabetes Neg Hx     ROS- All systems are reviewed and negative except as per the HPI above  Physical Exam: There were no vitals filed for this visit.  GEN- The patient is well appearing, alert and oriented x 3 today.   Head- normocephalic, atraumatic Eyes-  Sclera clear, conjunctiva pink Ears- hearing intact Oropharynx- clear Neck- supple, no JVP Lymph- no cervical lymphadenopathy Lungs- Clear to ausculation bilaterally, normal work of breathing Heart- Irregular rate and rhythm,  MR cannot hear AR  murmurs, rubs or gallops, PMI not  laterally displaced GI- soft, NT, ND, + BS Extremities- no clubbing, cyanosis, or edema MS- no significant deformity or atrophy Skin- no rash or lesion Psych- euthymic mood, full affect Neuro- strength and sensation are intact  EKG-  07/18/15  Afib with rvr at 113 bpm, QRS int 94 ms, QTc 465 ms  Assessment and Plan: 1. Afib:  Persistent with decreased EF on echo and only mild LAE.   [facility] scheduled for 08/28/15  Continue cardizem and xarelto  2. Snoring  sleep study scheduled  3. Lifestyle factors Weight loss/regular exercise encouraged  4. HTN continue cardizem    5. AR/MR:  Will reassess post [facility] with echo    Charlton Haws

## 2015-08-22 ENCOUNTER — Telehealth: Payer: Self-pay | Admitting: Cardiovascular Disease

## 2015-08-22 DIAGNOSIS — Z01812 Encounter for preprocedural laboratory examination: Secondary | ICD-10-CM

## 2015-08-22 NOTE — Telephone Encounter (Signed)
New Message  Pt wife called to cancel cardioversion sched for 1/23 due to a conflcit- pt wife wil call back top resch; Please call back and discuss.

## 2015-08-22 NOTE — Telephone Encounter (Signed)
Patient will not be able to have cardioversion on 2/23. Patient will be gone until March 10th. Patient wants to reschedule. Will forward to Dr. Eden Emms for possible available dates.

## 2015-08-25 ENCOUNTER — Other Ambulatory Visit: Payer: BLUE CROSS/BLUE SHIELD

## 2015-08-25 ENCOUNTER — Encounter: Payer: BLUE CROSS/BLUE SHIELD | Admitting: Cardiovascular Disease

## 2015-08-25 NOTE — Telephone Encounter (Signed)
Patient wants to only have Dr. Eden Emms do his cardioversion and he needs to have done on a Friday.   Patient is agreeable to have done on 10/10/15. Cannot get an early time, so Dr. Eden Emms will be able to do the procedure. Will wait closer to date to see if an earlier time can be scheduled. Patient is aware and we will keep him posted.

## 2015-08-26 NOTE — Telephone Encounter (Signed)
Called and rescheduled patient's cardioversion for 10/10/15 at 8 am with Dr. Eden Emms. Patient will have lab work done on 10/03/2015. Patient called and verbalized understanding and agreed to plan. Will mail instructions to patient. Went over instructions with patient also.

## 2015-09-22 ENCOUNTER — Telehealth: Payer: Self-pay | Admitting: Cardiovascular Disease

## 2015-09-22 NOTE — Telephone Encounter (Signed)
Get scheduled as soon as possible can be with some one else if needed

## 2015-09-22 NOTE — Telephone Encounter (Signed)
Jonathon Price is calling because they are wanting to cancel the procedure on 10/10/15  .   Thanks

## 2015-09-22 NOTE — Telephone Encounter (Signed)
Called Patient's wife (DPR) back. She stated that patient is unable to get off work for Cardioversion, and patient will call to reschedule when he can get time. Patient's wife also stated that patient thinks the new medication (cardizem) is working good. Will forward to Dr. Eden EmmsNishan, so he is aware.

## 2015-09-22 NOTE — Telephone Encounter (Signed)
Called patient's wife with Dr. Fabio BeringNishan's response. Patient's wife will let her husband know and he will call back with a date.

## 2015-10-03 ENCOUNTER — Other Ambulatory Visit: Payer: BLUE CROSS/BLUE SHIELD

## 2015-10-10 ENCOUNTER — Ambulatory Visit (HOSPITAL_COMMUNITY)
Admission: RE | Admit: 2015-10-10 | Payer: BLUE CROSS/BLUE SHIELD | Source: Ambulatory Visit | Admitting: Cardiovascular Disease

## 2015-10-10 ENCOUNTER — Encounter (HOSPITAL_COMMUNITY): Admission: RE | Payer: Self-pay | Source: Ambulatory Visit

## 2015-10-10 SURGERY — CARDIOVERSION
Anesthesia: Monitor Anesthesia Care

## 2015-10-23 ENCOUNTER — Other Ambulatory Visit: Payer: Self-pay | Admitting: *Deleted

## 2015-10-23 MED ORDER — DILTIAZEM HCL ER COATED BEADS 180 MG PO CP24
180.0000 mg | ORAL_CAPSULE | Freq: Every day | ORAL | Status: DC
Start: 2015-10-23 — End: 2016-07-09

## 2015-11-18 ENCOUNTER — Other Ambulatory Visit: Payer: Self-pay

## 2015-11-18 MED ORDER — RIVAROXABAN 20 MG PO TABS: 20.0000 mg | ORAL_TABLET | Freq: Every day | ORAL | Status: DC

## 2016-03-04 IMAGING — DX DG CHEST 2V
2 series · 2 of 2 positions shown · non-contrast
Comparison: None.

CLINICAL DATA: Hypertension. History of atrial fibrillation for 10
years.

EXAM:
CHEST  2 VIEW

[chest pa]
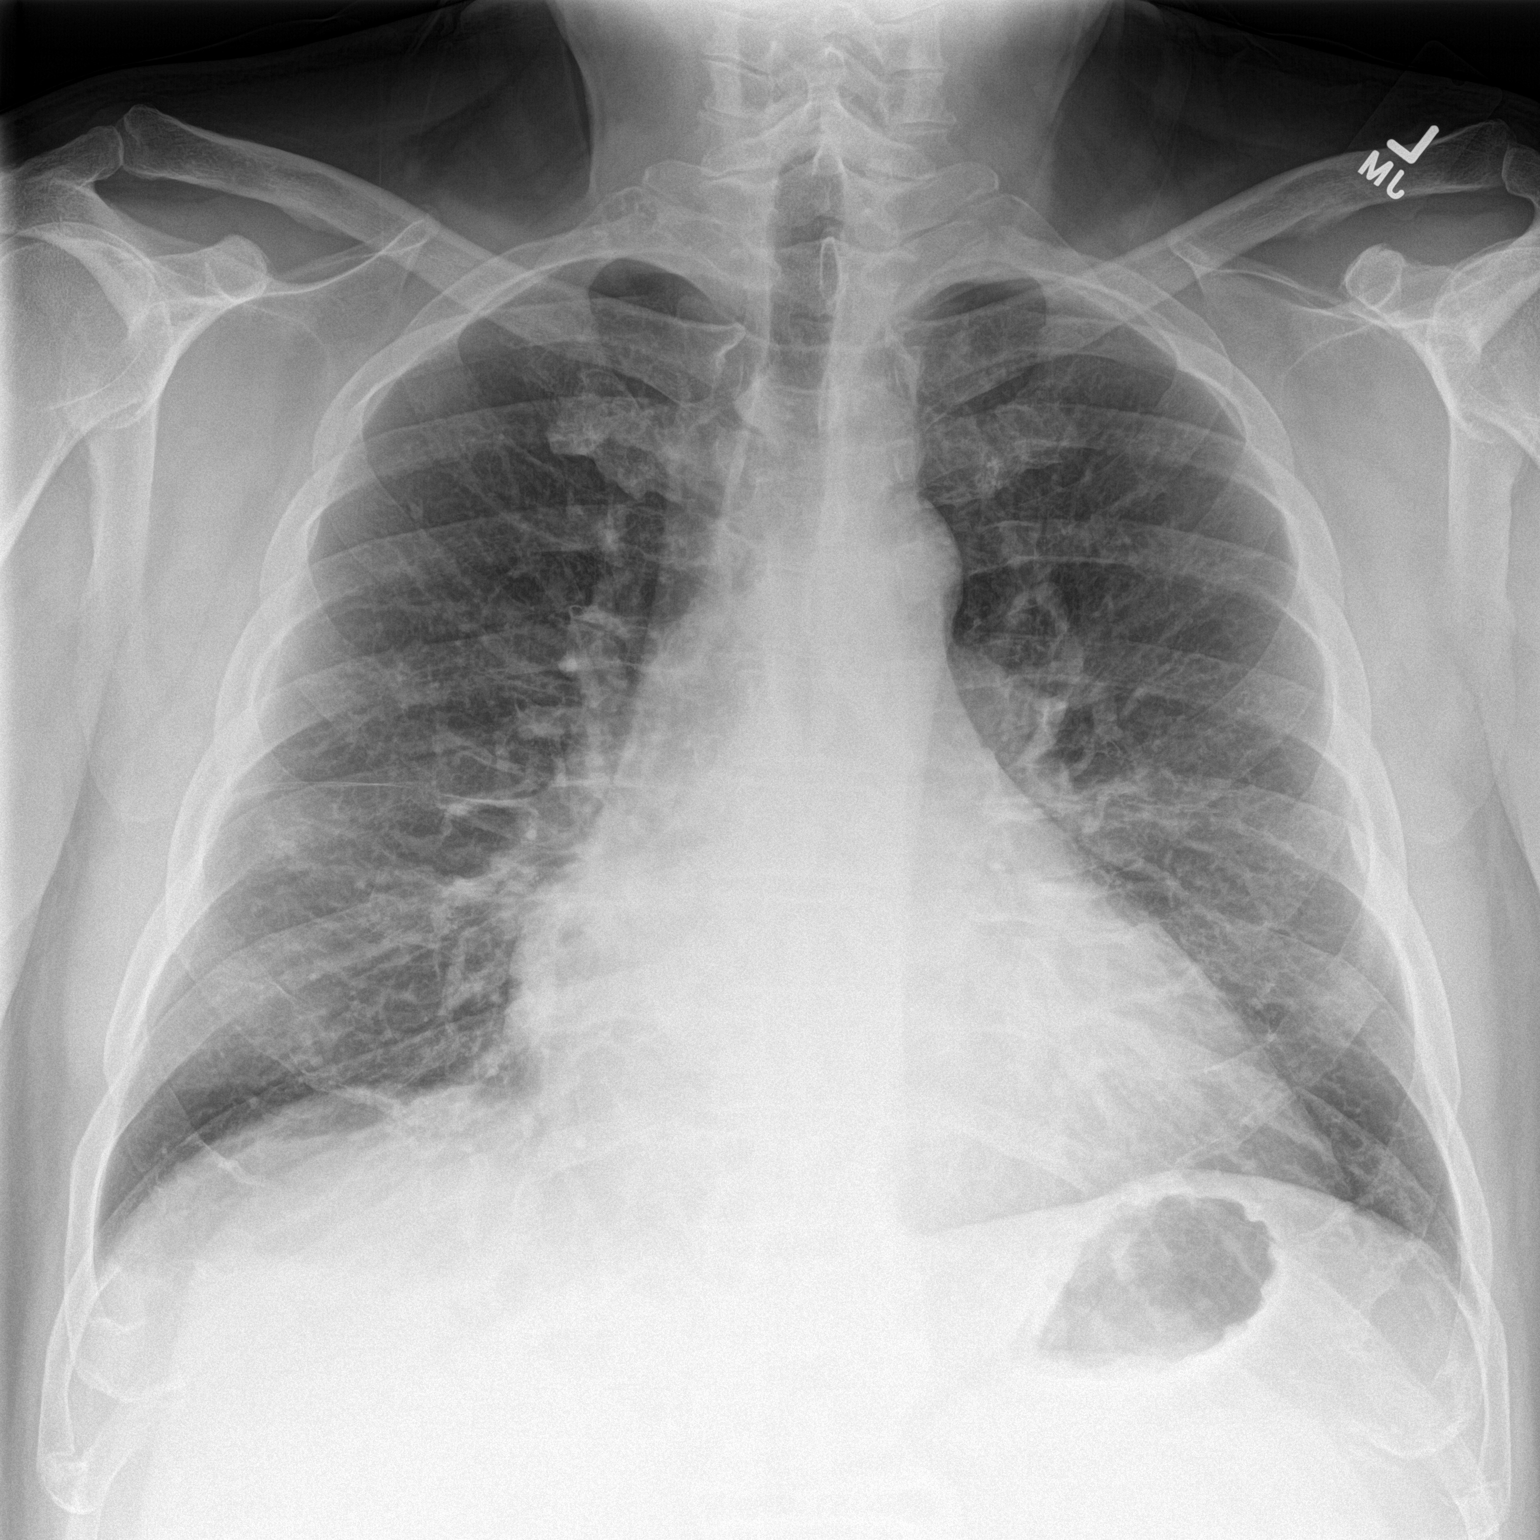

[chest lat]
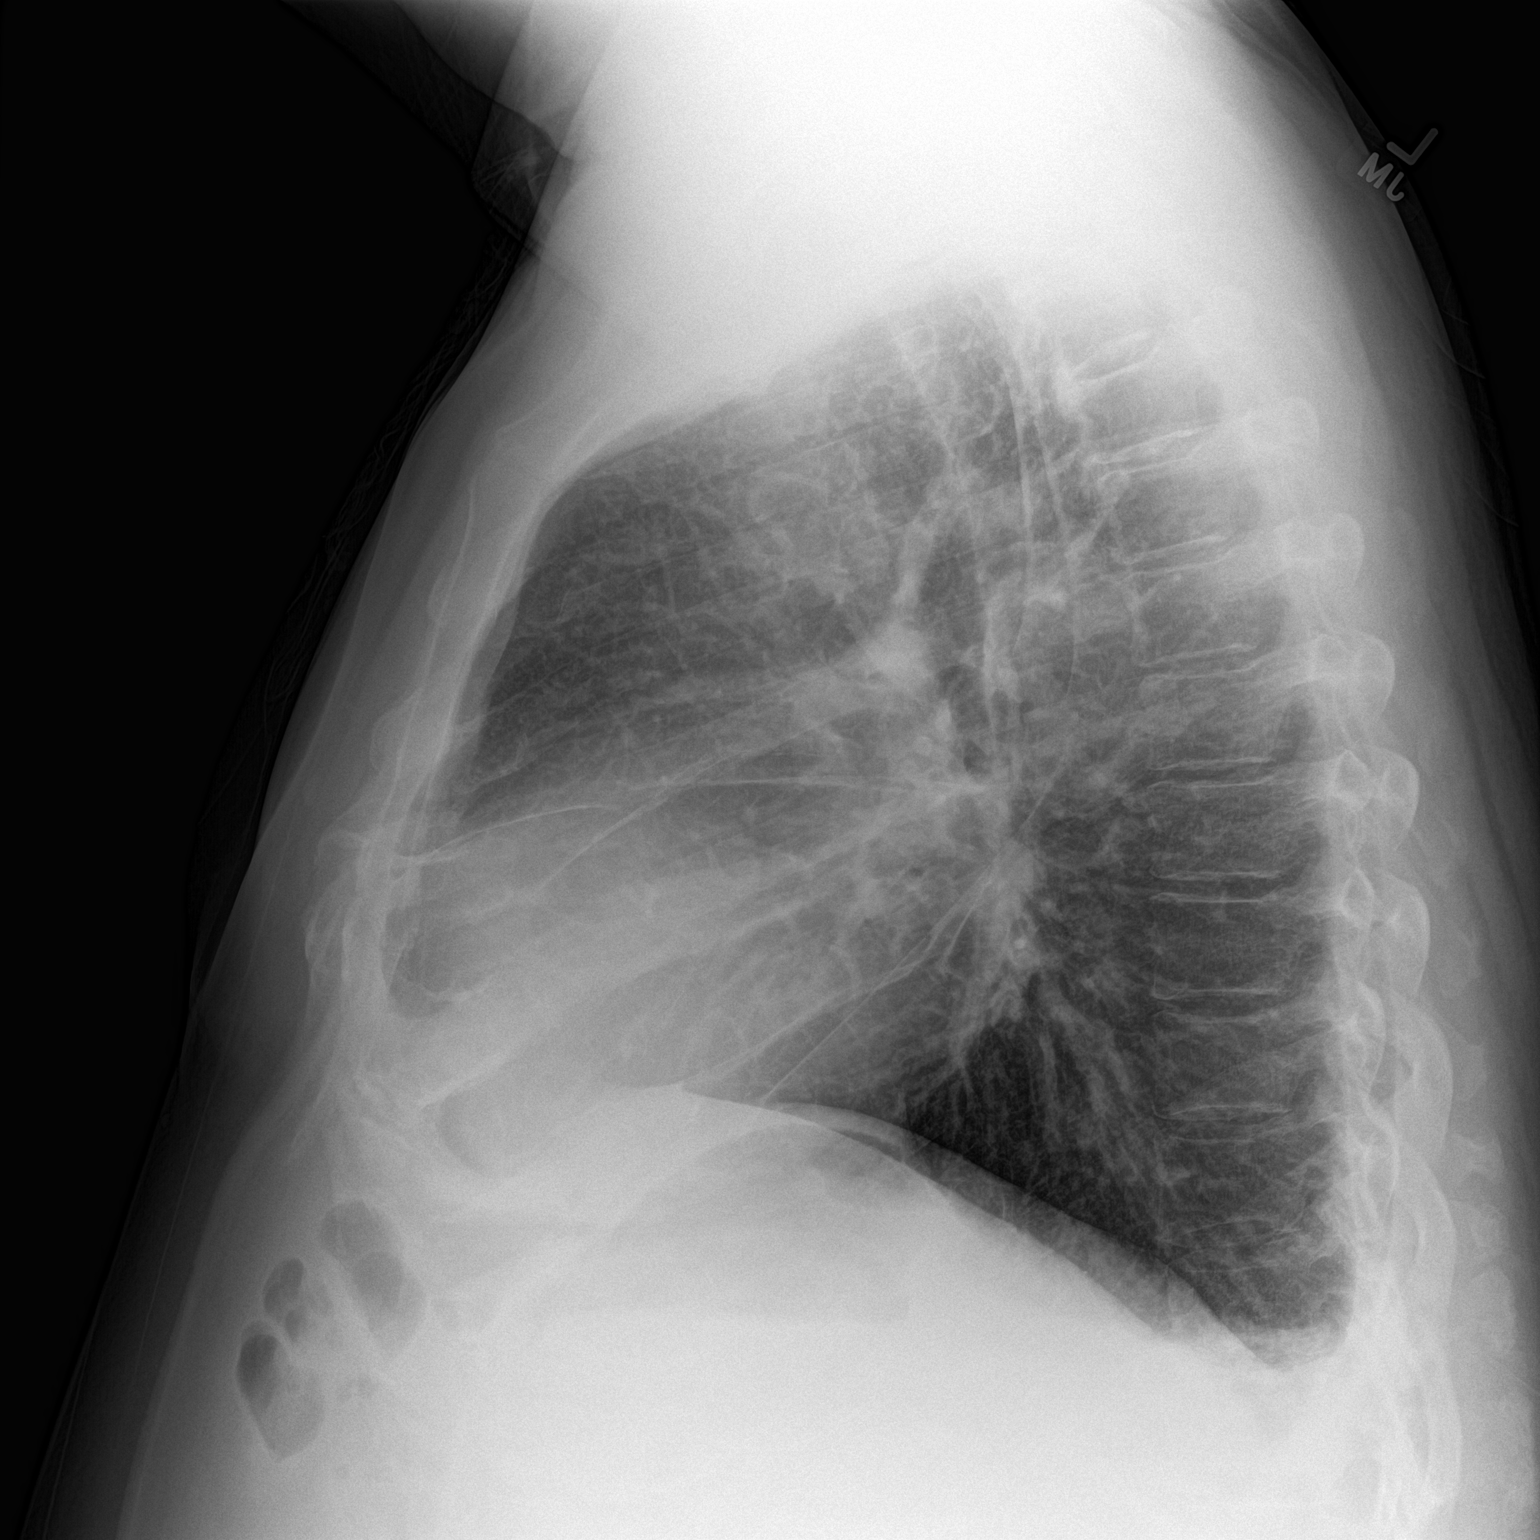

[2 of 2 positions shown; findings below may reference images not displayed]

FINDINGS: Mild cardiomegaly. No confluent airspace opacities or edema. Trace
bilateral pleural effusions. No acute bony abnormality.
IMPRESSION: Mild cardiomegaly with trace bilateral effusions.

## 2016-03-18 ENCOUNTER — Other Ambulatory Visit: Payer: Self-pay | Admitting: *Deleted

## 2016-03-18 MED ORDER — RIVAROXABAN 20 MG PO TABS: 20.0000 mg | ORAL_TABLET | Freq: Every day | ORAL | 0 refills | Status: DC

## 2016-05-05 ENCOUNTER — Telehealth: Payer: Self-pay

## 2016-05-05 NOTE — Telephone Encounter (Signed)
Called patient to follow-up. [place] been out of [place], so that is why he has not rescheduled his cardioversion. Patient stated he is here for the next 3 weeks, and would like to make an appointment. Patient stated he would have to call back with his schedule. Informed patient that even though Dr. Eden EmmsNishan does not have any open appts at this time, that he could see a PA or NP. Patient stated he does not feel like he is still in A. Fib, but he has been taking his medications as prescribed. Will wait to hear back from patient to schedule appointment for follow-up. Patient agreed to plan.

## 2016-05-17 NOTE — Telephone Encounter (Signed)
Called patient today to schedule an appointment with Dr. Eden EmmsNishan on Thursday. Patient stated he was not sure if he could do Thursday. Patient stated he would have to call later in the week to schedule. Encouraged patient to call sooner than later. Patient verbalized understanding.

## 2016-07-09 ENCOUNTER — Ambulatory Visit (INDEPENDENT_AMBULATORY_CARE_PROVIDER_SITE_OTHER): Payer: BLUE CROSS/BLUE SHIELD | Admitting: Internal Medicine

## 2016-07-09 ENCOUNTER — Encounter: Payer: Self-pay | Admitting: Internal Medicine

## 2016-07-09 VITALS — BP 122/78 | HR 79 | Temp 98.2°F | Resp 14 | Ht 74.0 in | Wt 269.0 lb

## 2016-07-09 DIAGNOSIS — I4891 Unspecified atrial fibrillation: Secondary | ICD-10-CM | POA: Diagnosis not present

## 2016-07-09 DIAGNOSIS — J019 Acute sinusitis, unspecified: Secondary | ICD-10-CM

## 2016-07-09 MED ORDER — DILTIAZEM HCL ER COATED BEADS 180 MG PO CP24: 180.0000 mg | ORAL_CAPSULE | Freq: Every day | ORAL | 1 refills | Status: DC

## 2016-07-09 MED ORDER — AMOXICILLIN 875 MG PO TABS: 875.0000 mg | ORAL_TABLET | Freq: Two times a day (BID) | ORAL | 0 refills | Status: DC

## 2016-07-09 MED ORDER — AZELASTINE HCL 0.1 % NA SOLN: 2.0000 | Freq: Every evening | NASAL | 3 refills | Status: DC | PRN

## 2016-07-09 NOTE — Patient Instructions (Signed)
Rest, fluids , tylenol  For cough:  Take Mucinex DM twice a day as needed until better  For nasal congestion: Use OTC Nasocort or Flonase : 2 nasal sprays on each side of the nose in the morning until you feel better Use ASTELIN a prescribed spray : 2 nasal sprays on each side of the nose at night until you feel better   Avoid decongestants such as  Pseudoephedrine or phenylephrine      Take the antibiotic as prescribed  (Amoxicillin) ONLY IF NO BETTER IN 2-3 DAYS   Call if not gradually better over the next  10 days  Call anytime if the symptoms are severe  ======  WILL REFILL CARDIAZEM X 2 MONTHS   WE ARE REFERRING YOU TO CARDIOLOGY, FURTHER REFILLS FROM THEM  SCHEDULE A PHYSICAL AT YOUR CONVENIENCE

## 2016-07-09 NOTE — Progress Notes (Signed)
Pre visit review using our clinic review tool, if applicable. No additional management support is needed unless otherwise documented below in the visit note. 

## 2016-07-09 NOTE — Progress Notes (Signed)
Subjective:    Patient ID: Jonathon Price, male    DOB: [birth year], 61 y.o.   MRN: [id number]  DOS:  07/09/2016 Type of visit - description : Acute visit Interval history Symptoms started a week ago with sinus congestion and pain, clear nasal discharge. He then developed some brownish nasal discharge. Some cough, not severe. He has a history of a-fib, request a refill on Cardizem   :Review of Systems  denies fever chills No chest pain or difficulty breathing No blood in the stools or in the urine. No chest congestion  Past Medical History:  Diagnosis Date  . A-fib (HCC)   . Hypertension     Past Surgical History:  Procedure Laterality Date  . right ankle operation  12 31 1978    Social History   Social History  . Marital status: Married    Spouse name: N/A  . Number of children: 2  . 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 Smoking status: Never Smoker  . Smokeless tobacco: Never Used  . Alcohol use No  . Drug use: No  . Sexual activity: Yes   Other Topics Concern  . Not on file   Social History Narrative  . No narrative on file      Allergies as of 07/09/2016   No Known Allergies     Medication List       Accurate as of 07/09/16 11:59 PM. Always use your most recent med list.          amoxicillin 875 MG tablet Commonly known as:  AMOXIL Take 1 tablet (875 mg total) by mouth 2 (two) times daily.   azelastine 0.1 % nasal spray Commonly known as:  ASTELIN Place 2 sprays into both nostrils at bedtime as needed for rhinitis. Use in each nostril as directed   diltiazem 180 MG 24 hr capsule Commonly known as:  CARDIZEM CD Take 1 capsule (180 mg total) by mouth daily.   rivaroxaban 20 MG Tabs tablet Commonly known as:  XARELTO Take 1 tablet (20 mg total) by mouth daily with supper.          Objective:   Physical Exam BP 122/78 (BP Location: Left Arm, Patient Position:  Sitting, Cuff Size: Normal)   Pulse 79   Temp 98.2 F (36.8 C) (Oral)   Resp 14   Ht 6\' 2"  (1.88 m)   Wt 269 lb (122 kg)   SpO2 98%   BMI 34.54 kg/m  General:   Well developed, well nourished . NAD.  HEENT:  Normocephalic . Face symmetric, atraumatic. TMs normal, throat symmetric, slightly red. Sinuses: Slightly tender bilaterally. Lungs:  CTA B Normal respiratory effort, no intercostal retractions, no accessory muscle use. Heart: Irregularly irregular,  no murmur.  No pretibial edema bilaterally  Skin: Not pale. Not jaundice Neurologic:  alert & oriented X3.  Speech normal, gait appropriate for age and unassisted Psych--  Cognition and judgment appear intact.  Cooperative with normal attention span and concentration.  Behavior appropriate. No anxious or depressed appearing.      Assessment & Plan:    Assessment > Atrial fibrillation -- PAF [place] ~ [place] in [place], intolerant to Coumadin (in 2004), intolerant to metoprolol (03-2015), Persistent as of 2017, saw cardiology last 07-2015, on Xarelto Snoring, Rx sleep study 03-2015 per cards  Elevated BP w/o Dx of HTN Chronic leg swelling (R) since surgery 1978 by~  1.5 inches  Plan  Sinusitis: Mild, rec conservative rx first, if not better start amoxicillin. See instructions A fibrillation: Request a refill on Cardizem, rx sent, further refills by cardiology. Will refer patient to them, I don't see that he has a pending appointment.  Rec RTC for CPX at his convenience

## 2016-07-11 NOTE — Assessment & Plan Note (Signed)
Sinusitis: Mild, rec conservative rx first, if not better start amoxicillin. See instructions A fibrillation: Request a refill on Cardizem, rx sent, further refills by cardiology. Will refer patient to them, I don't see that he has a pending appointment.  Rec RTC for CPX at his convenience

## 2016-09-28 NOTE — Progress Notes (Signed)
Patient ID: Jonathon Price, male   DOB: [birth year], 61 y.o.   MRN: [id number]  Primary Care Physician: Willow OraJose Paz, MD Referring Physician:same Cardiologist: None   Jonathon Price is a 61 y.o. male with a h/o PAF x 12 years Seen in the Afib clinic September . [place] followed by a cardiologist there. He states he was on coumadin at one time but stopped due to side effects. He currently has a chadsvasc score of 0 and is on a baby asa. He started having some elevation in BP and sought medical attention and was found to be in afib. In the office today, he is not aware of any change of being in afib, other than the fluctuations in BP. Denies fatigue, shortness of breath, palpitations. He currently is out of work but actively looking for a job.  He denies any alcohol use, caffeine use. He does snore with apneic spells per wife but pt denies symptoms of sleep apnea. He does not exercise and is overweight. He was placed on  metoprolol a few days ago for rate control, HR now 101 down from 135 bpm, but would like to change drug due to excessive gas.   He was started on cardizem.  There was some discussion about cardioversion but nothing was ever followed through with Sebastian Acheonna Carrol and Dr Johney FrameAllred I reviewed his echo and his EF is down at 40-45% and he only has mild LAE.  Given this I feel he should have attempt at conversion. He has some issues wit epistaxis usually in April related to sinus issues but no other bleeding issues  He prefers to be on NOAC.  And is willing to proceed   Echo 03/25/15  Study Conclusions  - Left ventricle: Wall thickness was increased in a pattern of mild LVH. Systolic function was mildly to moderately reduced. The estimated ejection fraction was in the range of 40% to 45%. - Aortic valve: There was moderate regurgitation. - Mitral valve: There was moderate regurgitation. - Left atrium: The atrium was mildly dilated. - Right atrium: The  atrium was moderately dilated. - Pulmonary arteries: Systolic pressure was mildly increased. PA peak pressure: 32 mm Hg (S).  He has been traveling and [facility]. 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 A-fib (HCC)   . Hypertension    Past Surgical History:  Procedure Laterality Date  . right ankle operation  12 31 1978    Current Outpatient Prescriptions  Medication Sig Dispense Refill  . diltiazem (CARDIZEM CD) 180 MG 24 hr capsule Take 1 capsule (180 mg total) by mouth daily. 90 capsule 3  . rivaroxaban (XARELTO) 20 MG TABS tablet Take 1 tablet (20 mg total) by mouth daily with supper. 90 tablet 3   No current facility-administered medications for this visit.     No Known Allergies  Social History   Social History  . Marital status: Married    Spouse name: N/A  . Number of children: 2  . 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 Smoking status: Never Smoker  . Smokeless tobacco: Never Used  . Alcohol use No  . Drug  use: No  . Sexual activity: Yes   Other Topics Concern  . Not on file   Social History Narrative  . No narrative on file    Family History  Problem Relation Age of Onset  . Breast cancer Mother   . CAD Neg Hx   . Atrial fibrillation Neg Hx   . Colon cancer Neg Hx   . Prostatitis Neg Hx   . Diabetes Neg Hx     ROS- All systems are reviewed and negative except as per the HPI above  Physical Exam: Vitals:   10/01/16 0818  BP: (!) 150/90  Pulse: 62  SpO2: 98%  Weight: 273 lb (123.8 kg)  Height: 6\' 1"  (1.854 m)    GEN- The patient is well appearing, alert and oriented x 3 today.   Head- normocephalic, atraumatic Eyes-  Sclera clear, conjunctiva pink Ears-  hearing intact Oropharynx- clear Neck- supple, no JVP Lymph- no cervical lymphadenopathy Lungs- Clear to ausculation bilaterally, normal work of breathing Heart- Irregular rate and rhythm,  MR cannot hear AR  murmurs, rubs or gallops, PMI not laterally displaced GI- soft, NT, ND, + BS Extremities- no clubbing, cyanosis, or edema MS- no significant deformity or atrophy Skin- no rash or lesion Psych- euthymic mood, full affect Neuro- strength and sensation are intact  EKG- Afib with rvr at 94 bpm, QRS int 94 ms, QTc 465 ms 10/01/16  Afib rate 109 otherwise normal   Assessment and Plan: 1. Afib:  Persistent with decreased EF on echo and only mild LAE. On Xarelto 20 mg continue cardizem  [facility] to be scheduled for end of May Risks discussed willing to proceed BMET CBC PLT day before Scheduled for   Stop ASA   2. Snoring  sleep study scheduled  3. Lifestyle factors Weight loss/regular exercise encouraged  4. HTN continue cardizem    5. AR/MR:  Will reassess post [facility]    [employer]

## 2016-10-01 ENCOUNTER — Encounter: Payer: Self-pay | Admitting: Cardiovascular Disease

## 2016-10-01 ENCOUNTER — Encounter (INDEPENDENT_AMBULATORY_CARE_PROVIDER_SITE_OTHER): Payer: Self-pay

## 2016-10-01 ENCOUNTER — Ambulatory Visit (INDEPENDENT_AMBULATORY_CARE_PROVIDER_SITE_OTHER): Payer: BLUE CROSS/BLUE SHIELD | Admitting: Cardiovascular Disease

## 2016-10-01 VITALS — BP 150/90 | HR 62 | Ht 73.0 in | Wt 273.0 lb

## 2016-10-01 DIAGNOSIS — I481 Persistent atrial fibrillation: Secondary | ICD-10-CM | POA: Diagnosis not present

## 2016-10-01 DIAGNOSIS — I4819 Other persistent atrial fibrillation: Secondary | ICD-10-CM

## 2016-10-01 MED ORDER — RIVAROXABAN 20 MG PO TABS: 20.0000 mg | ORAL_TABLET | Freq: Every day | ORAL | 3 refills | Status: DC

## 2016-10-01 MED ORDER — DILTIAZEM HCL ER COATED BEADS 180 MG PO CP24: 180.0000 mg | ORAL_CAPSULE | Freq: Every day | ORAL | 3 refills | Status: DC

## 2016-10-01 NOTE — Patient Instructions (Addendum)
Medication Instructions:  Your physician recommends that you continue on your current medications as directed. Please refer to the Current Medication list given to you today.  Labwork: NONE  Testing/Procedures: NONE  Follow-Up: Your physician wants you to follow-up with a call to Dr. Eden Emms to schedule a cardioversion in May.   If you need a refill on your cardiac medications before your next appointment, please call your pharmacy.

## 2016-11-09 ENCOUNTER — Telehealth: Payer: Self-pay

## 2016-11-09 NOTE — Telephone Encounter (Signed)
Left message for patient to call back  

## 2016-11-09 NOTE — Telephone Encounter (Signed)
-----   Message from Ethelda ChickPamela Pate Ingalls, RN sent at 10/04/2016  6:06 PM EDT ----- Patient needs a cardioversion in May

## 2016-11-24 NOTE — Telephone Encounter (Signed)
Patient stated he is in [place] at this time and then will be going to [place]. Patient stated maybe he can do it in June. Patient will call back in June to schedule.

## 2017-01-04 ENCOUNTER — Telehealth: Payer: Self-pay | Admitting: Cardiovascular Disease

## 2017-01-04 NOTE — Telephone Encounter (Signed)
Spoke with patient's wife who states [place] [facility] [place] 14 and is requesting patient's DCCV be on Wed. Aug 15 through Aug. 18.  I advised that I will forward message to Dr. Fabio BeringNishan's nurse, Elita QuickPam, for follow-up in the next few weeks. Patient's wife verbalized understanding and thanked me for the call.

## 2017-01-04 NOTE — Telephone Encounter (Signed)
New Message    Pt wife is trying to set up Cardioversion the week of 8/14-8/18 he will be back in town this time frame

## 2017-01-10 NOTE — Telephone Encounter (Signed)
Called Patient's wife (DPR), and informed her that patient will need to see NP first and if appropriate, he will be scheduled for cardioversion. Patient's wife verbalized understanding.

## 2017-01-10 NOTE — Telephone Encounter (Signed)
Follow up    Pt wife is asking if procedure would be the next day after seeing APP?

## 2017-01-10 NOTE — Telephone Encounter (Signed)
Left message for patient to call back  

## 2017-02-14 ENCOUNTER — Telehealth: Payer: Self-pay | Admitting: Cardiovascular Disease

## 2017-02-14 NOTE — Telephone Encounter (Signed)
Patient is concerned about having cardioversion done. Patient stated he feels asymptomatic and he heard from friends and coworkers that cardioversion does not work. Encouraged patient to keep his appointment for tomorrow and talk to Norma FredricksonLori Gerhardt NP about his options. Informed patient that he does not have a cardioversion set up yet, Dr. Eden EmmsNishan wanted him to see someone first. Patient agreed to plan and will keep his appointment tomorrow.

## 2017-02-14 NOTE — Telephone Encounter (Signed)
New message     Please call pt would like to speak to you about cardioversion and a few other things

## 2017-02-15 ENCOUNTER — Ambulatory Visit (INDEPENDENT_AMBULATORY_CARE_PROVIDER_SITE_OTHER): Payer: BLUE CROSS/BLUE SHIELD | Admitting: Internal Medicine

## 2017-02-15 ENCOUNTER — Other Ambulatory Visit: Payer: Self-pay | Admitting: *Deleted

## 2017-02-15 ENCOUNTER — Encounter: Payer: Self-pay | Admitting: Nurse Practitioner

## 2017-02-15 ENCOUNTER — Ambulatory Visit (INDEPENDENT_AMBULATORY_CARE_PROVIDER_SITE_OTHER): Payer: BLUE CROSS/BLUE SHIELD | Admitting: Nurse Practitioner

## 2017-02-15 ENCOUNTER — Encounter: Payer: Self-pay | Admitting: Internal Medicine

## 2017-02-15 VITALS — BP 126/74 | HR 79 | Temp 98.0°F | Resp 14 | Ht 73.0 in | Wt 270.2 lb

## 2017-02-15 VITALS — BP 128/80 | HR 128 | Ht 73.5 in | Wt 269.6 lb

## 2017-02-15 DIAGNOSIS — Z7901 Long term (current) use of anticoagulants: Secondary | ICD-10-CM

## 2017-02-15 DIAGNOSIS — I4819 Other persistent atrial fibrillation: Secondary | ICD-10-CM

## 2017-02-15 DIAGNOSIS — I481 Persistent atrial fibrillation: Secondary | ICD-10-CM

## 2017-02-15 DIAGNOSIS — I519 Heart disease, unspecified: Secondary | ICD-10-CM

## 2017-02-15 DIAGNOSIS — I5020 Unspecified systolic (congestive) heart failure: Secondary | ICD-10-CM

## 2017-02-15 DIAGNOSIS — I10 Essential (primary) hypertension: Secondary | ICD-10-CM | POA: Diagnosis not present

## 2017-02-15 DIAGNOSIS — I4891 Unspecified atrial fibrillation: Secondary | ICD-10-CM

## 2017-02-15 DIAGNOSIS — I4821 Permanent atrial fibrillation: Secondary | ICD-10-CM

## 2017-02-15 DIAGNOSIS — Z1211 Encounter for screening for malignant neoplasm of colon: Secondary | ICD-10-CM | POA: Diagnosis not present

## 2017-02-15 DIAGNOSIS — I482 Chronic atrial fibrillation: Secondary | ICD-10-CM | POA: Diagnosis not present

## 2017-02-15 DIAGNOSIS — Z1159 Encounter for screening for other viral diseases: Secondary | ICD-10-CM | POA: Diagnosis not present

## 2017-02-15 LAB — CBC WITH DIFFERENTIAL/PLATELET
BASOS PCT: 0.6 % (ref 0.0–3.0)
Basophils Absolute: 0 10*3/uL (ref 0.0–0.1)
EOS PCT: 4 % (ref 0.0–5.0)
Eosinophils Absolute: 0.3 10*3/uL (ref 0.0–0.7)
HCT: 50.8 % (ref 39.0–52.0)
Hemoglobin: 17.2 g/dL — ABNORMAL HIGH (ref 13.0–17.0)
LYMPHS ABS: 2.3 10*3/uL (ref 0.7–4.0)
Lymphocytes Relative: 29.2 % (ref 12.0–46.0)
MCHC: 33.9 g/dL (ref 30.0–36.0)
MCV: 91.1 fl (ref 78.0–100.0)
MONO ABS: 0.5 10*3/uL (ref 0.1–1.0)
MONOS PCT: 7 % (ref 3.0–12.0)
NEUTROS ABS: 4.7 10*3/uL (ref 1.4–7.7)
NEUTROS PCT: 59.2 % (ref 43.0–77.0)
Platelets: 214 10*3/uL (ref 150.0–400.0)
RBC: 5.57 Mil/uL (ref 4.22–5.81)
RDW: 14.7 % (ref 11.5–15.5)
WBC: 7.9 10*3/uL (ref 4.0–10.5)

## 2017-02-15 LAB — COMPREHENSIVE METABOLIC PANEL
ALK PHOS: 40 U/L (ref 39–117)
ALT: 16 U/L (ref 0–53)
AST: 20 U/L (ref 0–37)
Albumin: 4.2 g/dL (ref 3.5–5.2)
BUN: 20 mg/dL (ref 6–23)
CHLORIDE: 103 meq/L (ref 96–112)
CO2: 29 meq/L (ref 19–32)
Calcium: 9.2 mg/dL (ref 8.4–10.5)
Creatinine, Ser: 1.1 mg/dL (ref 0.40–1.50)
GFR: 71.82 mL/min (ref >60.00)
GLUCOSE: 118 mg/dL — AB (ref 70–99)
POTASSIUM: 4.2 meq/L (ref 3.5–5.1)
SODIUM: 137 meq/L (ref 135–145)
Total Bilirubin: 0.8 mg/dL (ref 0.2–1.2)
Total Protein: 6.5 g/dL (ref 6.0–8.3)

## 2017-02-15 LAB — TSH: TSH: 0.39 u[IU]/mL (ref 0.35–4.50)

## 2017-02-15 NOTE — Progress Notes (Addendum)
CARDIOLOGY OFFICE NOTE  Date:  02/15/2017    Jonathon Price Date of Birth: 1955-10-20 Medical Record #[id number]  PCP:  Wanda Plump, MD  Cardiologist:  Eden Emms  Chief Complaint  Patient presents with  . Atrial Fibrillation    Work in visit - seen for Dr. Eden Emms    History of Present Illness: Jonathon Price is a 61 y.o. male who presents today for a work in visit. Seen for Dr. Eden Emms.   He has a h/o PAF x 12 years. Has been seen in the Afib clinic September of 2016. He was first dx in [place] and was followed by a cardiologist there. He states he was on coumadin at one time but stopped due to side effects. He has preferred to be on NOAC for his anticoagulation. He currently has a chadsvasc score of 0 and is on a baby asa. He started having some elevation in BP and sought medical attention and was found to be in afib. Possible OSA, does not exercise and is overweight. Has not tolerated metoprolol in the past due to excessive gas.   Prior echo noted with EF down at 40-45% and he only had mild LAE. Dr. Eden Emms referred on for cardioversion. Several scheduling issues - looks like this was never done due to his scheduling/extensive travel issues.   Comes in today. Here alone. His home is actually in [place]. [place] is a [place] for his company that he works for. Says he traveled 18 1/2 hours to get here. Has been up since 5 am yesterday morning. Was under the impression he will be having cardioversion with Dr. Eden Emms tomorrow. He has never actually had a cardioversion - he has a family member who is a cardiologist who has told him that the possibility of remaining in NSR will be slim since he has been in AF so long. He is actually only here for his follow up.  Does not really feel bad at all. Says he feels "great". No real awareness of his AF. No palpitations - never has. Stays active and on his feet every day. He works as a Film/video editor. No chest pain, no  shortness of breath. Not dizzy. No passing out. BP is good at home - checks twice a day and HR averages 80. HR higher here today - he has been awake since 5 am and has been working with a "difficult customer". He has had his medicines today. Seeing PCP later today for complete labs.   Past Medical History:  Diagnosis Date  . A-fib (HCC)   . Hypertension     Past Surgical History:  Procedure Laterality Date  . right ankle operation  12 31 1978     Medications: Current Meds  Medication Sig  . diltiazem (CARDIZEM CD) 180 MG 24 hr capsule Take 1 capsule (180 mg total) by mouth daily.  . rivaroxaban (XARELTO) 20 MG TABS tablet Take 1 tablet (20 mg total) by mouth daily with supper.     Allergies: No Known Allergies  Social History: The patient  reports that he has never smoked. He has never used smokeless tobacco. He reports that he does not drink alcohol or use drugs.   Family History: The patient's family history includes Breast cancer in his mother.   Review of Systems: Please see the history of present illness.   Otherwise, the review of systems is positive for none.   All other systems are reviewed and negative.  Physical Exam: VS:  BP 128/80 (BP Location: Left Arm, Patient Position: Sitting, Cuff Size: Large)   Pulse (!) 128   Ht 6' 1.5" (1.867 m)   Wt 269 lb 9.6 oz (122.3 kg)   BMI 35.09 kg/m  .  BMI Body mass index is 35.09 kg/m.  Wt Readings from Last 3 Encounters:  02/15/17 269 lb 9.6 oz (122.3 kg)  10/01/16 273 lb (123.8 kg)  07/09/16 269 lb (122 kg)    General: Pleasant. Obese. Alert and in no acute distress.   HEENT: Normal.  Neck: Supple, no JVD, carotid bruits, or masses noted.  Cardiac: Irregular irregular rhythm. Rate is fast today. Soft systolic murmur noted. No edema.  Respiratory:  Lungs are clear to auscultation bilaterally with normal work of breathing.  GI: Soft and nontender.  MS: No deformity or atrophy. Gait and ROM intact.  Skin: Warm and  dry. Color is normal.  Neuro:  Strength and sensation are intact and no gross focal deficits noted.  Psych: Alert, appropriate and with normal affect.   LABORATORY DATA:  EKG:  EKG is ordered today. This demonstrates atrial fib with an increased VR today.  Lab Results  Component Value Date   WBC 10.5 07/18/2015   HGB 17.0 07/18/2015   HCT 49.1 07/18/2015   PLT 227 07/18/2015   GLUCOSE 100 (H) 03/28/2015   CHOL 169 03/28/2015   TRIG 88.0 03/28/2015   HDL 43.50 03/28/2015   LDLCALC 108 (H) 03/28/2015   ALT 35 03/28/2015   AST 20 03/28/2015   NA 141 03/28/2015   K 5.0 03/28/2015   CL 106 03/28/2015   CREATININE 1.22 03/28/2015   BUN 22 03/28/2015   CO2 27 03/28/2015   TSH 1.36 03/28/2015   PSA normal; patient reported 07/05/2012     BNP (last 3 results) No results for input(s): BNP in the last 8760 hours.  ProBNP (last 3 results) No results for input(s): PROBNP in the last 8760 hours.   Other Studies Reviewed Today:  Echo 03/25/15  Study Conclusions  - Left ventricle: Wall thickness was increased in a pattern of mild LVH. Systolic function was mildly to moderately reduced. The estimated ejection fraction was in the range of 40% to 45%. - Aortic valve: There was moderate regurgitation. - Mitral valve: There was moderate regurgitation. - Left atrium: The atrium was mildly dilated. - Right atrium: The atrium was moderately dilated. - Pulmonary arteries: Systolic pressure was mildly increased. PA peak pressure: 32 mm Hg (S).   Assessment/Plan: 1. Afib:  Persistent with decreased EF on echo and noted bilateral atrial enlargement from 2 years ago - he is not symptomatic at all - has been out of rhythm apparently for several years. I think the likelihood of restoring NSR will be quite slim - probably would need AAD therapy - he does not wish to be on any more medicines. He has no symptoms that we could improve on. His schedule is quite erratic as well. He prefers  to continue with current management of rate control and anticoagulation. He is adamant that his rate is ok at home - I have asked him to monitor more closely over the next few days. He will be back here over Labor day - will get echo updated to ensure that we have not had worsening LV function. Tentatively see back in 6 months.   2. Snoring - tells me he has had a sleep study and it "checked out ok".   3. Lifestyle factors -  Weight loss/regular exercise encouraged  4. HTN- BP ok here today - continue cardizem    5. AR/MR:  needs repeat echo  6. Chronic anticoagulation - having labs later today. Will ask for CBC  Current medicines are reviewed with the patient today.  The patient does not have concerns regarding medicines other than what has been noted above.  The following changes have been made:  See above.  Labs/ tests ordered today include:   No orders of the defined types were placed in this encounter.    Disposition:   FU with Dr. Eden EmmsNishan in 6 months tentatively.   Patient is agreeable to this plan and will call if any problems develop in the interim.   SignedNorma Fredrickson: Aero Drummonds, NP  02/15/2017 11:14 AM  [facility] [address] [place], [place]  [address] Phone: [phone] Fax: [phone]       Addendum: 04/06/17  Mamie NickHey Chellie Vanlue,  You scheduled an echocardiogram back in August for this patient. The patient cancelled due appointment conflicts. He was suppose to call back but never did. He is now in [place] and says it will be awhile before he comes back. Do want us to remove him from the WQ and just order it again when he comes back to [place]?   Erie NoeVanessa

## 2017-02-15 NOTE — Patient Instructions (Addendum)
We will be checking the following labs today - NONE   Medication Instructions:    Continue with your current medicines.     Testing/Procedures To Be Arranged:  Echocardiogram  Follow-Up:   Will see how the echo turns out.   See Dr. Eden EmmsNishan back in 6 months tentatively.     Other Special Instructions:   Please show this to Dr. Drue NovelPaz - we would like a CBC on your labs that he is drawing today.     If you need a refill on your cardiac medications before your next appointment, please call your pharmacy.   Call the [facility] office at [phone] if you have any questions, problems or concerns.

## 2017-02-15 NOTE — Progress Notes (Signed)
Pre visit review using our clinic review tool, if applicable. No additional management support is needed unless otherwise documented below in the visit note. 

## 2017-02-15 NOTE — Patient Instructions (Addendum)
GO TO THE LAB : Get the blood work     GO TO THE FRONT DESK Schedule your next appointment for a  Physical exam next month, fasting

## 2017-02-15 NOTE — Progress Notes (Signed)
Subjective:    Patient ID: Jonathon Price, male    DOB: 09-17-1955, 61 y.o.   MRN: [id number]  DOS:  02/15/2017 Type of visit - description : rov Interval history: Here today for a checkup as requested by his heart doctor. Reports good medication compliance. Feels well.    Review of Systems Denies chest pain or difficulty breathing No nausea, vomiting, diarrhea. No blood in the stools. No gross hematuria  Past Medical History:  Diagnosis Date  . A-fib (HCC)   . Hypertension     Past Surgical History:  Procedure Laterality Date  . right ankle operation  12 31 1978    Social History   Social History  . Marital status: Married    Spouse name: N/A  . Number of children: 2  . Years of education: N/A   Occupational History  . Emergency planning/management officer     Social History Main Topics  . Smoking status: Never Smoker  . Smokeless tobacco: Never Used  . Alcohol use No  . Drug use: No  . Sexual activity: Yes   Other Topics Concern  . Not on file   Social History Narrative   Home is [place], works in [employer] most of the time , travels to different states in an RV      Allergies as of 02/15/2017   No Known Allergies     Medication List       Accurate as of 02/15/17 11:59 PM. Always use your most recent med list.          diltiazem 180 MG 24 hr capsule Commonly known as:  CARDIZEM CD Take 1 capsule (180 mg total) by mouth daily.   rivaroxaban 20 MG Tabs tablet Commonly known as:  XARELTO Take 1 tablet (20 mg total) by mouth daily with supper.          Objective:   Physical Exam BP 126/74 (BP Location: Left Arm, Patient Position: Sitting, Cuff Size: Small)   Pulse 79   Temp 98 F (36.7 C) (Oral)   Resp 14   Ht 6\' 1"  (1.854 m)   Wt 270 lb 4 oz (122.6 kg)   SpO2 97%   BMI 35.66 kg/m  General:   Well developed, well nourished . NAD.  HEENT:  Normocephalic . Face symmetric, atraumatic Lungs:  CTA B Normal respiratory effort, no intercostal  retractions, no accessory muscle use. Heart: Irregularly irregular,  soft systolic murmur? Right calf larger, no TTP.  Skin: Some discoloration at the distal right lower extremity particularly at the lateral aspect of the pretibial area Neurologic:  alert & oriented X3.  Speech normal, gait appropriate for age and unassisted Psych--  Cognition and judgment appear intact.  Cooperative with normal attention span and concentration.  Behavior appropriate. No anxious or depressed appearing.      Assessment & Plan:   Assessment > Atrial fibrillation -- PAF dx ~ 2004 in [place], intolerant to Coumadin (in 2004), intolerant to metoprolol (03-2015), Persistent as of 2017, saw cardiology last 07-2015, on Xarelto Snoring, Rx sleep study 03-2015 per cards  Elevated BP w/o Dx of HTN Chronic leg swelling (R) since surgery 1978 by~ 1.5 inches  Plan  Atrial fibrillation: Managed by cardiology, will get a CMP, CBC and TSH. Primary care: Since he is here, we talk about other issues: Td  2013 per pt, no documentation. Declined PNM 23 CCS: Never had a colonoscopy, discussed different screening modalities pro/cons, elected IFOB Prostate ca screening on RTC Check  Hep C RTC for CPX next month

## 2017-02-16 LAB — HEPATITIS C ANTIBODY: HCV Ab: NONREACTIVE

## 2017-02-16 NOTE — Assessment & Plan Note (Signed)
Atrial fibrillation: Managed by cardiology, will get a CMP, CBC and TSH. Primary care: Since he is here, we talk about other issues: Td  2013 per pt, no documentation. Declined PNM 23 CCS: Never had a colonoscopy, discussed different screening modalities pro/cons, elected IFOB Prostate ca screening on RTC Check Hep C RTC for CPX next month

## 2017-03-03 ENCOUNTER — Other Ambulatory Visit (HOSPITAL_COMMUNITY): Payer: BLUE CROSS/BLUE SHIELD

## 2017-03-18 ENCOUNTER — Ambulatory Visit: Payer: BLUE CROSS/BLUE SHIELD | Admitting: Internal Medicine

## 2017-09-22 ENCOUNTER — Other Ambulatory Visit: Payer: Self-pay | Admitting: *Deleted

## 2017-09-22 MED ORDER — DILTIAZEM HCL ER COATED BEADS 180 MG PO CP24: 180.0000 mg | ORAL_CAPSULE | Freq: Every day | ORAL | 0 refills | Status: DC

## 2017-09-26 ENCOUNTER — Other Ambulatory Visit: Payer: Self-pay | Admitting: Cardiovascular Disease

## 2017-09-26 MED ORDER — RIVAROXABAN 20 MG PO TABS: 20.0000 mg | ORAL_TABLET | Freq: Every day | ORAL | 0 refills | Status: DC

## 2017-09-26 MED ORDER — DILTIAZEM HCL ER COATED BEADS 180 MG PO CP24: 180.0000 mg | ORAL_CAPSULE | Freq: Every day | ORAL | 1 refills | Status: DC

## 2017-09-26 NOTE — Telephone Encounter (Signed)
New message    *STAT* If patient is at the pharmacy, call can be transferred to refill team.   1. Which medications need to be refilled? (please list name of each medication and dose if known)   diltiazem (CARDIZEM CD) 180 MG 24 hr capsule Take 1 capsule (180 mg total) by mouth daily.    rivaroxaban (XARELTO) 20 MG TABS tablet Take 1 tablet (20 mg total) by mouth daily with supper.        2. Which pharmacy/location (including street and city if local pharmacy) is medication to be sent to? [facility] [place]   3. Do they need a 30 day or 90 day supply?  90

## 2017-09-27 ENCOUNTER — Telehealth: Payer: Self-pay

## 2017-09-27 NOTE — Telephone Encounter (Signed)
I have done a Xarelto PA through covermymeds. 

## 2017-12-20 NOTE — Telephone Encounter (Signed)
Per message received through covermymeds:  Approved on April 17  Case Id: [id number];Status:Approved;Review Type:Prior Auth;Coverage Start Date:08/28/2017;Coverage End Date:09/27/2018;

## 2018-02-07 ENCOUNTER — Encounter: Payer: BLUE CROSS/BLUE SHIELD | Admitting: Internal Medicine

## 2018-07-24 ENCOUNTER — Telehealth: Payer: Self-pay | Admitting: Cardiovascular Disease

## 2018-07-24 NOTE — Telephone Encounter (Signed)
New message   Patient needs a referral to Dr. Renella Cunas located at [address] [facility] [place], [place] [address]. Phone # is [phone]. Please call to discuss.

## 2018-07-24 NOTE — Telephone Encounter (Signed)
Left message for patient to call back  

## 2018-07-24 NOTE — Telephone Encounter (Signed)
New message       *STAT* If patient is at the pharmacy, call can be transferred to refill team.   1. Which medications need to be refilled? (please list name of each medication and dose if known)diltiazem (CARDIZEM CD) 180 MG 24 hr capsule  rivaroxaban (XARELTO) 20 MG TABS tablet  2. Which pharmacy/location (including street and city if local pharmacy) is medication to be sent to?[facility] [address]  [place], [place] [address]  [phone] tel #   3. Do they need a 30 day or 90 day supply? 90

## 2018-07-24 NOTE — Telephone Encounter (Signed)
Pt is overdue for follow up, has not been seen in clinic since 02/2017. Also needs BMET and CBC prior to Xarelto refill to ensure pt remains on the correct dose. His current address and refill request are in [place] - called pt who states he lives there permanently now.  See separate phone note from 1/20 - Pam has reached out to Dr Eden Emms regarding refills and cardiologist referral. He has 7 days of Xarelto and 14 days of diltiazem left.

## 2018-07-24 NOTE — Telephone Encounter (Signed)
Patient's wife called back. Patient's wife stated they have moved to [place]. She stated patient needs a referral to a cardiologist in [place], Dr. Renella Cunas. Patient is also needing refills on his heart medications for at least a month. Informed patient's wife that her husband has not been seen in over a year (02/2017) and that I would send a message to Dr. Eden Emms to see if this is okay. Encouraged patient's wife to find her husband a PCP to help with refills and referrals. Informed patient's wife that patient will need to get a consent to sign for records as well. Will forward to Dr. Eden Emms for advisement.

## 2018-07-24 NOTE — Telephone Encounter (Signed)
Ok to refill scripts for 3 months I don't know any cardiologist in [place]

## 2018-07-26 ENCOUNTER — Telehealth: Payer: Self-pay | Admitting: Cardiovascular Disease

## 2018-07-26 NOTE — Telephone Encounter (Signed)
Left message for patient to call back  

## 2018-07-26 NOTE — Telephone Encounter (Signed)
New Message         Patient's wife is calling today to check with you to see if it is ok to see a PA, patient needed a specific date (02/07) which I was able to get but i'ts with a PA, she is wondering if this is ok.

## 2018-08-03 NOTE — Telephone Encounter (Signed)
Patient has appt with NP on 08/11/18

## 2018-08-11 ENCOUNTER — Encounter: Payer: Self-pay | Admitting: Cardiology

## 2018-08-11 ENCOUNTER — Ambulatory Visit (INDEPENDENT_AMBULATORY_CARE_PROVIDER_SITE_OTHER): Payer: BLUE CROSS/BLUE SHIELD | Admitting: Cardiology

## 2018-08-11 VITALS — BP 176/82 | HR 70 | Ht 73.0 in | Wt 257.8 lb

## 2018-08-11 DIAGNOSIS — I4821 Permanent atrial fibrillation: Secondary | ICD-10-CM

## 2018-08-11 DIAGNOSIS — I351 Nonrheumatic aortic (valve) insufficiency: Secondary | ICD-10-CM

## 2018-08-11 DIAGNOSIS — I340 Nonrheumatic mitral (valve) insufficiency: Secondary | ICD-10-CM

## 2018-08-11 DIAGNOSIS — I10 Essential (primary) hypertension: Secondary | ICD-10-CM

## 2018-08-11 LAB — BASIC METABOLIC PANEL
BUN/Creatinine Ratio: 23 (ref 10–24)
BUN: 23 mg/dL (ref 8–27)
CO2: 24 mmol/L (ref 20–29)
CREATININE: 1.01 mg/dL (ref 0.76–1.27)
Calcium: 9.8 mg/dL (ref 8.6–10.2)
Chloride: 99 mmol/L (ref 96–106)
GFR, EST AFRICAN AMERICAN: 90 mL/min/{1.73_m2} (ref >59)
GFR, EST NON AFRICAN AMERICAN: 78 mL/min/{1.73_m2} (ref >59)
Glucose: 160 mg/dL — ABNORMAL HIGH (ref 65–99)
POTASSIUM: 4.3 mmol/L (ref 3.5–5.2)
SODIUM: 140 mmol/L (ref 134–144)

## 2018-08-11 MED ORDER — HYDROCHLOROTHIAZIDE 25 MG PO TABS: 25.0000 mg | ORAL_TABLET | Freq: Every day | ORAL | 3 refills | Status: DC

## 2018-08-11 NOTE — Patient Instructions (Signed)
Medication Instructions:  1.) START: Hydrochlorothiazide 25 mg daily  If you need a refill on your cardiac medications before your next appointment, please call your pharmacy.   Lab work: TODAY: BMET & CBC  If you have labs (blood work) drawn today and your tests are completely normal, you will receive your results only by: Marland Kitchen MyChart Message (if you have MyChart) OR . A paper copy in the mail If you have any lab test that is abnormal or we need to change your treatment, we will call you to review the results.  Testing/Procedures: None  Follow-Up: You have been referred to see Dr.Lew in [place], Someone will contact you with a appointment    Any Other Special Instructions Will Be Listed Below (If Applicable).   DASH Eating Plan DASH stands for "Dietary Approaches to Stop Hypertension." The DASH eating plan is a healthy eating plan that has been shown to reduce high blood pressure (hypertension). It may also reduce your risk for type 2 diabetes, heart disease, and stroke. The DASH eating plan may also help with weight loss. What are tips for following this plan?  General guidelines  Avoid eating more than 2,300 mg (milligrams) of salt (sodium) a day. If you have hypertension, you may need to reduce your sodium intake to 1,500 mg a day.  Limit alcohol intake to no more than 1 drink a day for nonpregnant women and 2 drinks a day for men. One drink equals 12 oz of beer, 5 oz of wine, or 1 oz of hard liquor.  Work with your health care provider to maintain a healthy body weight or to lose weight. Ask what an ideal weight is for you.  Get at least 30 minutes of exercise that causes your heart to beat faster (aerobic exercise) most days of the week. Activities may include walking, swimming, or biking.  Work with your health care provider or diet and nutrition specialist (dietitian) to adjust your eating plan to your individual calorie needs. Reading food labels   Check food labels  for the amount of sodium per serving. Choose foods with less than 5 percent of the Daily Value of sodium. Generally, foods with less than 300 mg of sodium per serving fit into this eating plan.  To find whole grains, look for the word "whole" as the first word in the ingredient list. Shopping  Buy products labeled as "low-sodium" or "no salt added."  Buy fresh foods. Avoid canned foods and premade or frozen meals. Cooking  Avoid adding salt when cooking. Use salt-free seasonings or herbs instead of table salt or sea salt. Check with your health care provider or pharmacist before using salt substitutes.  Do not fry foods. Cook foods using healthy methods such as baking, boiling, grilling, and broiling instead.  Cook with heart-healthy oils, such as olive, canola, soybean, or sunflower oil. Meal planning  Eat a balanced diet that includes: ? 5 or more servings of fruits and vegetables each day. At each meal, try to fill half of your plate with fruits and vegetables. ? Up to 6-8 servings of whole grains each day. ? Less than 6 oz of lean meat, poultry, or fish each day. A 3-oz serving of meat is about the same size as a deck of cards. One egg equals 1 oz. ? 2 servings of low-fat dairy each day. ? A serving of nuts, seeds, or beans 5 times each week. ? Heart-healthy fats. Healthy fats called Omega-3 fatty acids are found in foods  such as flaxseeds and coldwater fish, like sardines, salmon, and mackerel.  Limit how much you eat of the following: ? Canned or prepackaged foods. ? Food that is high in trans fat, such as fried foods. ? Food that is high in saturated fat, such as fatty meat. ? Sweets, desserts, sugary drinks, and other foods with added sugar. ? Full-fat dairy products.  Do not salt foods before eating.  Try to eat at least 2 vegetarian meals each week.  Eat more home-cooked food and less restaurant, buffet, and fast food.  When eating at a restaurant, ask that your food  be prepared with less salt or no salt, if possible. What foods are recommended? The items listed may not be a complete list. Talk with your dietitian about what dietary choices are best for you. Grains Whole-grain or whole-wheat bread. Whole-grain or whole-wheat pasta. Brown rice. Orpah Cobb. Bulgur. Whole-grain and low-sodium cereals. Pita bread. Low-fat, low-sodium crackers. Whole-wheat flour tortillas. Vegetables Fresh or frozen vegetables (raw, steamed, roasted, or grilled). Low-sodium or reduced-sodium tomato and vegetable juice. Low-sodium or reduced-sodium tomato sauce and tomato paste. Low-sodium or reduced-sodium canned vegetables. Fruits All fresh, dried, or frozen fruit. Canned fruit in natural juice (without added sugar). Meat and other protein foods Skinless chicken or [place]. Ground chicken or [place]. Pork with fat trimmed off. Fish and seafood. Egg whites. Dried beans, peas, or lentils. Unsalted nuts, nut butters, and seeds. Unsalted canned beans. Lean cuts of beef with fat trimmed off. Low-sodium, lean deli meat. Dairy Low-fat (1%) or fat-free (skim) milk. Fat-free, low-fat, or reduced-fat cheeses. Nonfat, low-sodium ricotta or cottage cheese. Low-fat or nonfat yogurt. Low-fat, low-sodium cheese. Fats and oils Soft margarine without trans fats. Vegetable oil. Low-fat, reduced-fat, or light mayonnaise and salad dressings (reduced-sodium). Canola, safflower, olive, soybean, and sunflower oils. Avocado. Seasoning and other foods Herbs. Spices. Seasoning mixes without salt. Unsalted popcorn and pretzels. Fat-free sweets. What foods are not recommended? The items listed may not be a complete list. Talk with your dietitian about what dietary choices are best for you. Grains Baked goods made with fat, such as croissants, muffins, or some breads. Dry pasta or rice meal packs. Vegetables Creamed or fried vegetables. Vegetables in a cheese sauce. Regular canned vegetables (not  low-sodium or reduced-sodium). Regular canned tomato sauce and paste (not low-sodium or reduced-sodium). Regular tomato and vegetable juice (not low-sodium or reduced-sodium). Rosita Fire. Olives. Fruits Canned fruit in a light or heavy syrup. Fried fruit. Fruit in cream or butter sauce. Meat and other protein foods Fatty cuts of meat. Ribs. Fried meat. Tomasa Blase. Sausage. Bologna and other processed lunch meats. Salami. Fatback. Hotdogs. Bratwurst. Salted nuts and seeds. Canned beans with added salt. Canned or smoked fish. Whole eggs or egg yolks. Chicken or [place] with skin. Dairy Whole or 2% milk, cream, and half-and-half. Whole or full-fat cream cheese. Whole-fat or sweetened yogurt. Full-fat cheese. Nondairy creamers. Whipped toppings. Processed cheese and cheese spreads. Fats and oils Butter. Stick margarine. Lard. Shortening. Ghee. Bacon fat. Tropical oils, such as coconut, palm kernel, or palm oil. Seasoning and other foods Salted popcorn and pretzels. Onion salt, garlic salt, seasoned salt, table salt, and sea salt. Worcestershire sauce. Tartar sauce. Barbecue sauce. Teriyaki sauce. Soy sauce, including reduced-sodium. Steak sauce. Canned and packaged gravies. Fish sauce. Oyster sauce. Cocktail sauce. Horseradish that you find on the shelf. Ketchup. Mustard. Meat flavorings and tenderizers. Bouillon cubes. Hot sauce and Tabasco sauce. Premade or packaged marinades. Premade or packaged taco seasonings. Relishes. Regular salad  dressings. Where to find more information:  National Heart, Lung, and [place]: https://wilson-eaton.com/  American Heart Association: www.heart.org Summary  The DASH eating plan is a healthy eating plan that has been shown to reduce high blood pressure (hypertension). It may also reduce your risk for type 2 diabetes, heart disease, and stroke.  With the DASH eating plan, you should limit salt (sodium) intake to 2,300 mg a day. If you have hypertension, you may need to reduce  your sodium intake to 1,500 mg a day.  When on the DASH eating plan, aim to eat more fresh fruits and vegetables, whole grains, lean proteins, low-fat dairy, and heart-healthy fats.  Work with your health care provider or diet and nutrition specialist (dietitian) to adjust your eating plan to your individual calorie needs. This information is not intended to replace advice given to you by your health care provider. Make sure you discuss any questions you have with your health care provider. Document Released: 06/10/2011 Document Revised: 06/14/2016 Document Reviewed: 06/14/2016 Elsevier Interactive Patient Education  2019 [employer].

## 2018-08-11 NOTE — Progress Notes (Addendum)
Cardiology Office Note:    Date:  08/11/2018   ID:  Jonathon Price, DOB 1956/05/14, MRN [id number]  PCP:  Wanda PlumpPaz, Jose E, MD  Cardiologist:  Charlton HawsPeter Nishan, MD  Referring MD: Wanda PlumpPaz, Jose E, MD   Chief Complaint  Patient presents with  . Follow-up    Atrial fibrillation    History of Present Illness:    Jonathon Price is a 63 y.o. male with a past medical history significant for paroxysmal atrial fibrillation x13 years on Xarelto.  He was first diagnosed in [place] and was followed by a cardiologist there.  He was on Coumadin at one time but stopped due to side effects.  He also did not tolerate metoprolol in the past due to excessive gas.  Echo in 2016 noted with EF down to 40-45% and only had mild LAE.  Dr. Eden EmmsNishan referred him for cardioversion which looks like was not done due to scheduling/extensive travel issues.  [place] company that he works for.  Prior notes indicate that the patient has a family member who is a cardiologist who has told him that the possibility of remaining in sinus rhythm with a cardioversion will be slim since he has been in A. fib for so long.  He has been tolerating the A. fib without issues.  There has been a question of sleep apnea, he says he had a home sleep study in [place] and he was told that he did not need treatment.   CHA2DS2/VAS Stroke Risk score is 1 for hypertension.    Today he is here alone. He reports that his BP has been running high. [place] was seen [place] for pulled shoulder muscle. His cardizem was increased for elevated BP and HR. His BP has not improved much per pt. He denies chest pain/pressure, DOE, palpitations, lightheadedness or syncope. No orthopnea, PND or edema other than his chronic right leg swelling related to an accident 40 years ago. His only symptoms is that he feels warmth in his face when his BP is up. Then he reports that since his cardizem was increased he is feeling a lack of  energy.   He is currently being treated for sinus issues with augmentin, prednisone, and benzonatate. He has had postnasal drainage and cough which is improving.   He averages 15,000 steps per day per his Fitbit. No exertional symptoms.    He is getting ready to retire and live full time in [place] and is requesting a referral to [place] [facility] [place]  [address] [place], [place] [id number]. Phone # is [phone].   Past Medical History:  Diagnosis Date  . A-fib (HCC)   . Hypertension     Past Surgical History:  Procedure Laterality Date  . right ankle operation  12 31 1978    Current Medications: Current Meds  Medication Sig  . diltiazem (CARDIZEM CD) 300 MG 24 hr capsule Take 300 mg by mouth daily.  . rivaroxaban (XARELTO) 20 MG TABS tablet Take 1 tablet (20 mg total) by mouth daily with supper.     Allergies:   Patient has no known allergies.   Social History   Socioeconomic History  . Marital status: Married    Spouse name: Not on file  . Number of children: 2  . Years of education: Not on file  . Highest education level: Not on file  Occupational History  . Occupation: Emergency planning/management officerproject manager   Social Needs  .  Financial resource strain: Not on file  . Food insecurity:    Worry: Not on file    Inability: Not on file  . Transportation needs:    Medical: Not on file    Non-medical: Not on file  Tobacco Use  . Smoking status: Never Smoker  . Smokeless tobacco: Never Used  Substance and Sexual Activity  . Alcohol use: No    Alcohol/week: 0.0 standard drinks  . Drug use: No  . Sexual activity: Yes  Lifestyle  . Physical activity:    Days per week: Not on file    Minutes per session: Not on file  . Stress: Not on file  Relationships  . Social connections:    Talks on phone: Not on file    Gets together: Not on file    Attends religious service: Not on file    Active member of club or organization: Not on file    Attends meetings of clubs or organizations: Not on  file    Relationship status: Not on file  Other Topics Concern  . Not on file  Social History Narrative   Home is [place], works in [employer] most of the time , travels to different states in an RV     Family History: The patient's family history includes Breast cancer in his mother. There is no history of CAD, Atrial fibrillation, Colon cancer, Prostatitis, or Diabetes. ROS:   Please see the history of present illness.     All other systems reviewed and are negative.  EKGs/Labs/Other Studies Reviewed:    The following studies were reviewed today:  Echocardiogram 03/25/2015 Study Conclusions  - Left ventricle: Wall thickness was increased in a pattern of mild   LVH. Systolic function was mildly to moderately reduced. The   estimated ejection fraction was in the range of 40% to 45%. - Aortic valve: There was moderate regurgitation. - Mitral valve: There was moderate regurgitation. - Left atrium: The atrium was mildly dilated. - Right atrium: The atrium was moderately dilated. - Pulmonary arteries: Systolic pressure was mildly increased. PA   peak pressure: 32 mm Hg (S).   EKG:  EKG is ordered today.  The ekg ordered today demonstrates atrial fibrillation at 111 bpm, nonspecific ST abnormality  Recent Labs: No results found for requested labs within last 8760 hours.   Recent Lipid Panel    Component Value Date/Time   CHOL 169 03/28/2015 0728   TRIG 88.0 03/28/2015 0728   HDL 43.50 03/28/2015 0728   CHOLHDL 4 03/28/2015 0728   VLDL 17.6 03/28/2015 0728   LDLCALC 108 (H) 03/28/2015 0728    Physical Exam:    VS:  BP (!) 176/82   Pulse 70   Ht 6\' 1"  (1.854 m)   Wt 257 lb 12.8 oz (116.9 kg)   SpO2 97%   BMI 34.01 kg/m     Wt Readings from Last 3 Encounters:  08/11/18 257 lb 12.8 oz (116.9 kg)  02/15/17 270 lb 4 oz (122.6 kg)  02/15/17 269 lb 9.6 oz (122.3 kg)     Physical Exam  Constitutional: He is oriented to person, place, and time. He appears well-developed and  well-nourished.  Central obesity  HENT:  Head: Normocephalic and atraumatic.  Neck: Normal range of motion. Neck supple. No JVD present.  Cardiovascular: An irregularly irregular rhythm present. Exam reveals no gallop and no friction rub.  No murmur heard. Pulmonary/Chest: Effort normal and breath sounds normal. No respiratory distress. He has no wheezes. He  has no rales.  Abdominal: Soft. Bowel sounds are normal.  Musculoskeletal: Normal range of motion.        General: Edema present.     Comments: Phonic right leg edema, no edema of the left leg  Neurological: He is alert and oriented to person, place, and time.  Skin: Skin is warm and dry.  Psychiatric: He has a normal mood and affect. His behavior is normal. Judgment and thought content normal.  Vitals reviewed.   ASSESSMENT:    1. Permanent atrial fibrillation   2. Essential (primary) hypertension   3. Aortic valve insufficiency, etiology of cardiac valve disease unspecified   4. Mitral valve insufficiency, unspecified etiology    PLAN:    In order of problems listed above:  1.  Permanent atrial fibrillation -Long history of atrial fibrillation, rate controlled with diltiazem.   -Patient has no awareness of his A. Fib. -Echocardiogram in 2016 showed decreased EF and mild biatrial enlargement. -Patient has not wanted to attempt electrical cardioversion and he probably has quite a slim chance of restoring sinus rhythm. -EKG shows rate of 111 bpm.  Still has unawareness of his afib. Rate control is not optimal and may be playing a role in his decreased EF.  We would really like to obtain an updated echocardiogram, however the patient is moving to [place] and would rather have this done there.  Patient has been intolerant to beta-blocker in the past.  He does not really want to increase diltiazem because he says he has felt a lack of energy with the last increase to 300 mg.  Further rate control will need to be addressed by  cardiologist in [place]. -Patient is on Xarelto for stroke risk reduction with CHA2DS2/VAS score of 1 for hypertension.  No unusual bleeding.  He needs labs done today. -Patient is actually from [place] and will be transferring his cardiology care to a cardiologist in [place].  Hypertension -Poorly controlled.  Diltiazem was increased to 300 mg when the patient was seen in [place] for shoulder injury and noted to have increased blood pressure.  The patient reports that his blood pressure is not much better.  I will add hydrochlorothiazide 25 mg daily and he will need to follow-up in [place]. -Instructed on Dash diet  Snoring -Patient reports that he had a sleep study in [place] which did not indicate a need for CPAP.  Obesity Body mass index is 34.01 kg/m.  Patient has central obesity putting it at increased vascular risk. -He says that he walks on average 15,000 steps per day according to his Fitbit.  He says that he is very active although no formal exercise with no exertional symptoms.  Valvular issues -Moderate aortic regurgitation and moderate mitral regurgitation noted on echo in 2016.  Patient was to have a follow-up echo in 2018 but this was not done as he was in [place].   -He will need to follow-up with this in [place].  We are placing a referral to cardiologist, Dr. Renella CunasLew in [place], and placing a call to have him seen soon. He plans to be in [place] in 2-3 weeks. He is leaving [place] today for [place].   Addendum post visit- CBC was ordered to follow anticoagulation but the tube required for this lab was not drawn. The patient was called when this was discovered but he was already on his way to [place]. He will need CBC when he gets to [place].   Medication Adjustments/Labs and Tests Ordered: Current medicines are reviewed  at length with the patient today.  Concerns regarding medicines are outlined above. Labs and tests ordered and medication changes are outlined in the patient  instructions below:  There are no Patient Instructions on file for this visit.   Signed, Berton Bon, NP  08/11/2018 8:35 AM    [facility]

## 2018-08-15 ENCOUNTER — Other Ambulatory Visit: Payer: Self-pay

## 2018-08-15 MED ORDER — RIVAROXABAN 20 MG PO TABS: 20.0000 mg | ORAL_TABLET | Freq: Every day | ORAL | 1 refills | Status: DC

## 2018-08-15 NOTE — Telephone Encounter (Signed)
Pt needs refills on Xarelto   Pharmacy: [id number] [place]- 1997 [address]

## 2018-08-29 ENCOUNTER — Telehealth: Payer: Self-pay | Admitting: Cardiovascular Disease

## 2018-08-29 NOTE — Telephone Encounter (Signed)
Called patient's wife and let her know that our office will send referral and last office note to Dr. Higinio Roger in [place], [place] at phone number [phone]. Patient's wife did not have a fax number. Will send over to Medical Records.

## 2018-08-29 NOTE — Telephone Encounter (Signed)
Will send message to Dr. Eden Emms for referral.

## 2018-08-29 NOTE — Telephone Encounter (Signed)
Can just fax our last office note and referral to them

## 2018-08-29 NOTE — Telephone Encounter (Signed)
New Message:     Pt's wife called and wanted to know if Dr Eden Emms had did a referral for husband to see Cardiologist in [place]. She would like for him to refer him to Dr Higinio Roger in [place], [place]. His phone number is [phone]. Please call today if possible and let her know.

## 2018-08-29 NOTE — Telephone Encounter (Signed)
Medical records sent on 08/18/18 to Dr. Renella Cunas at patient's request. Sent by fax to [phone].

## 2018-09-11 NOTE — Telephone Encounter (Signed)
Follow Up:    Patient calling back concerning some papers. Dr. Renella Cunas have not received the paper work. Please call you questions.

## 2018-09-12 NOTE — Telephone Encounter (Signed)
Records faxed to [phone] on 08/18/18 and 08/30/18.

## 2018-11-13 ENCOUNTER — Telehealth: Payer: Self-pay

## 2018-11-13 NOTE — Telephone Encounter (Signed)
**Note De-Identified [name] Obfuscation** I did a Xarelto PA (Key: [id number]) through covermymeds and received the following message quickly after:  Jonathon Price Key: [id number] - PA Case ID: [id number] - Rx #: [id number] Outcome  Approved: today  Case QR:[id number]; Status:Approved, Review Type:Prior Auth;Coverage  Start Date:10/14/2018 Coverage End Date:11/13/2019  I have notified [facility] of this approval.

## 2019-01-23 ENCOUNTER — Encounter: Payer: Self-pay | Admitting: Cardiology

## 2019-02-02 LAB — POCT INR

## 2019-02-06 ENCOUNTER — Telehealth: Payer: Self-pay | Admitting: Cardiovascular Disease

## 2019-02-06 NOTE — Telephone Encounter (Signed)
Spoke with patient who is going to pick up the records on Friday and have them faxed to the office.  Fax number given to patient.  Requested he write ATTN:  Dr Johnsie Cancel on it.

## 2019-02-06 NOTE — Telephone Encounter (Signed)
  Patient is calling because he was referred to a cardiologist down in [place] where he will be for 6-8 months. He saw that cardiologist and some of the things that doctor wants to do Jonathon Price feels doesn't make sense and he would like to discuss with Dr Johnsie Cancel.

## 2019-02-06 NOTE — Telephone Encounter (Signed)
Spoke with patient who is presently living in [place] and has been seeing Dr Jimmye Norman.  Pt is concerns and would like to speak with Dr Johnsie Cancel regarding the care he is receiving there and not feeling comfortable with some of the decisions being made.  Pt reports he has been scheduled for a cardiac cath for this Monday.  He is unsure of the reason he is scheduled for this procedure.  He has not been having any chest pain, SOB etc and most reason echo per pt report was stable compared to the last one here.  Pt states MD there told him all his medications he is taking for his At Fib is wrong and not helping him and that he needs a pacemaker.  Pt reports recent 24 hr monitor with HR from 50's (resting) to 150's with activity that was more than his normal, permanent At Fib.  His resting HR is usually in the 50s per his report.   Recent BP 119/70 and 128/78.  Advised pt I will forward this information to Dr Johnsie Cancel for his review however without having Dr Jimmye Norman records he may not be able to advise pt on Dr Jimmye Norman decisions.  Mainly the patient is wanting to know if he should go through the cardiac cath scheduled for this Monday.  Pt also states he is willing to come back here from [place] to have an appointment with Dr Johnsie Cancel for f/u if necessary.  He trusts Dr Johnsie Cancel and values his input.  Pt will be available at phone # listed in this call.

## 2019-02-06 NOTE — Telephone Encounter (Signed)
I spoke with him he is going to fax a copy of his echo and monitor to [place] You might want to call him with our fax number and make sure the reports get to me thanks

## 2019-02-07 NOTE — Telephone Encounter (Signed)
Please watch for records on Friday that are being faxed to Dr Johnsie Cancel.  He needs these ASAP for review.

## 2019-02-09 NOTE — Telephone Encounter (Signed)
Records received.  Medical records will scan into the computer under the media tab.  Will route to Dr. Johnsie Cancel to make him aware.

## 2019-02-10 ENCOUNTER — Other Ambulatory Visit: Payer: Self-pay | Admitting: Cardiovascular Disease

## 2019-02-12 NOTE — Telephone Encounter (Signed)
Pt last saw Daune Perch, NP 08/11/18, last labs 08/11/18 Creat 1.01, age 63, weight 116.9kg, CrCl 120.57, based on CrCl pt is on appropriate dosage of Xarelto 20mg  QD.  Will refill rx.

## 2019-03-06 ENCOUNTER — Telehealth: Payer: Self-pay | Admitting: Cardiovascular Disease

## 2019-03-06 NOTE — Telephone Encounter (Signed)
I spoke to the patient who is presently in [place] and recently had a heart cath and would like to send the information to Dr Johnsie Cancel to review, then call the patient to discuss.  He would really like to keep Dr Johnsie Cancel as his cardiologist while residing in [place].  He is willing to travel to [facility] for appointments.

## 2019-03-06 NOTE — Telephone Encounter (Signed)
Patient would like to speak to Dr. Johnsie Cancel about some of the results he just got.

## 2019-03-09 NOTE — Telephone Encounter (Signed)
I spoke to the patient who sent his cath report and it is scanned  under "chart review" "media."  He resides in [place] and would like you to call him [phone] to advise about his cath, and he has medication concerns.  Thank you

## 2019-03-09 NOTE — Telephone Encounter (Signed)
Follow up ° ° °Patient is returning call. Please call the patient. °

## 2019-03-10 NOTE — Telephone Encounter (Signed)
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

## 2019-07-20 DIAGNOSIS — E119 Type 2 diabetes mellitus without complications: Secondary | ICD-10-CM | POA: Insufficient documentation

## 2019-12-25 DIAGNOSIS — E7800 Pure hypercholesterolemia, unspecified: Secondary | ICD-10-CM | POA: Insufficient documentation

## 2019-12-25 DIAGNOSIS — I420 Dilated cardiomyopathy: Secondary | ICD-10-CM | POA: Insufficient documentation

## 2020-02-10 ENCOUNTER — Other Ambulatory Visit: Payer: Self-pay | Admitting: Cardiovascular Disease

## 2020-02-11 NOTE — Telephone Encounter (Signed)
Xarelto refill received. The pt now resides in [place] and has a cardiologist there per last notes.

## 2021-01-08 NOTE — Progress Notes (Signed)
CARDIOLOGY CONSULT NOTE       Patient ID: Jonathon Price MRN: [id number] DOB/AGE: Oct 13, 1955 65 y.o.  Referring Physician: [facility]  Primary Physician: No primary care provider on file. Primary Cardiologist: I have not seen since 2018 Reason for Consultation: Afib/Cardiomyopathy   HPI:  65 y.o. with history of chronic afib. Now on xarelto. History of DCM presumed non ischemic followed by cardiology in [place] before Dr Riley Nearing did cath 02/26/19 with no significant CAD EF 25-30% TTE done at [facility] EF 30-35% mild AR/MR Had discussions about [facility] but this was never arranged as patient does not live here and home in [place] and his company has a base her in [place]. Seen by NP 02/15/17 and note indicates being asymptomatic and patient prefers to adopt strategy of rate control and anticoagulation ? 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 with no palpitations , syncope , chest pain or dyspnea   Discussed starting entresto rather than ARB and updated echo   ROS All other systems reviewed and negative except as noted above  Past Medical History:  Diagnosis Date   A-fib (HCC)    Hypertension     Family History  Problem Relation Age of Onset   Breast cancer Mother    CAD Neg Hx    Atrial fibrillation Neg Hx    Colon cancer Neg Hx    Prostatitis Neg Hx    Diabetes Neg Hx     Social History   Socioeconomic History   Marital status: Married    Spouse name: Not on file   Number of children: 2   Years of education: Not on file   Highest education level: Not on file  Occupational History   Occupation: Emergency planning/management officer    Tobacco Use   Smoking status: Never   Smokeless tobacco: Never  Substance and Sexual Activity   Alcohol use: No    Alcohol/week: 0.0 standard drinks   Drug use: No   Sexual activity: Yes  Other Topics Concern   Not on file  Social History Narrative   Home is [place], works in [employer] most of the time , travels to different states in an RV   Social Determinants of Corporate investment banker Strain: Not on file  Food Insecurity: Not on file  Transportation Needs: Not on file  Physical Activity: Not on file  Stress: Not on file  Social Connections: Not on file  Intimate Partner Violence: Not on file    Past Surgical History:  Procedure Laterality Date   right ankle operation  12 31 1978      Current Outpatient Medications:    diltiazem (CARDIZEM CD) 300 MG 24 hr capsule, Take 300 mg by mouth daily., Disp: , Rfl:    hydrochlorothiazide (HYDRODIURIL) 25 MG tablet, Take 1 tablet (25 mg total) by mouth daily., Disp: 90 tablet, Rfl: 3   XARELTO 20 MG TABS tablet, TAKE 1 TABLET (20 MG TOTAL) BY MOUTH DAILY WITH SUPPER., Disp: 90 tablet, Rfl: 1    Physical Exam: There were no vitals taken for this visit.  Affect appropriate Healthy:  appears stated age HEENT: normal Neck supple with no adenopathy JVP normal no bruits no thyromegaly Lungs clear with no wheezing and good diaphragmatic motion Heart:  S1/S2 no murmur, no rub, gallop or click PMI normal Abdomen: benighn, BS positve, no tenderness, no AAA no bruit.  No HSM or HJR Distal pulses intact with no bruits No edema Neuro non-focal Skin warm and dry No muscular weakness   Labs:   Lab Results  Component Value Date   WBC 7.9 02/15/2017   HGB 17.2 (H) 02/15/2017   HCT 50.8 02/15/2017   MCV 91.1 02/15/2017   PLT 214.0 02/15/2017   No results for input(s): NA, K, CL, CO2, BUN, CREATININE, CALCIUM, PROT, BILITOT, ALKPHOS, ALT, AST, GLUCOSE in the last 168 hours.  Invalid input(s): LABALBU No results found for:  CKTOTAL, CKMB, CKMBINDEX, TROPONINI  Lab Results  Component Value Date   CHOL 169 03/28/2015   Lab Results  Component Value Date   HDL 43.50 03/28/2015   Lab Results  Component Value Date   LDLCALC 108 (H) 03/28/2015   Lab Results  Component Value Date   TRIG 88.0 03/28/2015   Lab Results  Component Value Date   CHOLHDL 4 03/28/2015   No results found for: LDLDIRECT    Radiology: No results found.  EKG: afib nonspecific ST changes    ASSESSMENT AND PLAN:   AFib:  rate control is fine on Xarelto TTE to assess atrial sizes  Cardiomyopathy:  no significant CAD by cath in [place] 02/2019 D/c ARB start Entresto. F/U pharm D to titrate In 4-6 weeks update echo needs further assessment regarding AICD Consider adding aldactone once Entresto dose stable  Anticoagulation:  Currently on xarelto CHADVASC 2 for age and HTN    Echo for DCM Entresto started ARB d/c F/U Pharm D 4-6 weeks me in 3 months   Signed: Charlton Haws 01/08/2021, 4:30 PM

## 2021-01-12 ENCOUNTER — Other Ambulatory Visit: Payer: Self-pay

## 2021-01-12 ENCOUNTER — Ambulatory Visit (INDEPENDENT_AMBULATORY_CARE_PROVIDER_SITE_OTHER): Payer: Medicare Other | Admitting: Cardiovascular Disease

## 2021-01-12 ENCOUNTER — Encounter: Payer: Self-pay | Admitting: Cardiovascular Disease

## 2021-01-12 VITALS — BP 128/80 | HR 97 | Ht 72.0 in | Wt 231.0 lb

## 2021-01-12 DIAGNOSIS — Z7901 Long term (current) use of anticoagulants: Secondary | ICD-10-CM

## 2021-01-12 DIAGNOSIS — I429 Cardiomyopathy, unspecified: Secondary | ICD-10-CM

## 2021-01-12 DIAGNOSIS — I482 Chronic atrial fibrillation, unspecified: Secondary | ICD-10-CM

## 2021-01-12 MED ORDER — SACUBITRIL-VALSARTAN 24-26 MG PO TABS: 1.0000 | ORAL_TABLET | Freq: Two times a day (BID) | ORAL | 11 refills | Status: DC

## 2021-01-12 NOTE — Patient Instructions (Addendum)
Medication Instructions:  Your physician has recommended you make the following change in your medication:   1-STOP Losartan 2-START Entresto 24/26 mg by mouth twice daily.  *If you need a refill on your cardiac medications before your next appointment, please call your pharmacy*  Lab Work: Your physician recommends that you return for lab work in: 3 weeks for BMET  If you have labs (blood work) drawn today and your tests are completely normal, you will receive your results only by: MyChart Message (if you have MyChart) OR A paper copy in the mail If you have any lab test that is abnormal or we need to change your treatment, we will call you to review the results.   Testing/Procedures: Your physician has requested that you have an echocardiogram. Echocardiography is a painless test that uses sound waves to create images of your heart. It provides your doctor with information about the size and shape of your heart and how well your heart's chambers and valves are working. This procedure takes approximately one hour. There are no restrictions for this procedure.   Follow-Up: At [facility], you and your health needs are our priority.  As part of our continuing mission to provide you with exceptional heart care, we have created designated Provider Care Teams.  These Care Teams include your primary Cardiologist (physician) and Advanced Practice Providers (APPs -  Physician Assistants and Nurse Practitioners) who all work together to provide you with the care you need, when you need it.  We recommend signing up for the patient portal called "MyChart".  Sign up information is provided on this After Visit Summary.  MyChart is used to connect with patients for Virtual Visits (Telemedicine).  Patients are able to view lab/test results, encounter notes, upcoming appointments, etc.  Non-urgent messages can be sent to your provider as well.   To learn more about what you can do with MyChart, go to  [email or web address].    Your next appointment:   3 month(s)  The format for your next appointment:   In Person  Provider:   You may see Charlton Haws, MD or one of the following Advanced Practice Providers on your designated Care Team:   Nada Boozer, NP   You have been referred to Pharmacist for Entresto Titration in 3 weeks.

## 2021-02-02 ENCOUNTER — Ambulatory Visit (INDEPENDENT_AMBULATORY_CARE_PROVIDER_SITE_OTHER): Payer: Medicare Other | Admitting: Pharmacist

## 2021-02-02 ENCOUNTER — Ambulatory Visit (HOSPITAL_COMMUNITY): Payer: Medicare Other | Attending: Internal Medicine

## 2021-02-02 ENCOUNTER — Other Ambulatory Visit: Payer: Self-pay

## 2021-02-02 ENCOUNTER — Other Ambulatory Visit: Payer: Medicare Other | Admitting: *Deleted

## 2021-02-02 DIAGNOSIS — I429 Cardiomyopathy, unspecified: Secondary | ICD-10-CM

## 2021-02-02 DIAGNOSIS — I5020 Unspecified systolic (congestive) heart failure: Secondary | ICD-10-CM | POA: Diagnosis not present

## 2021-02-02 LAB — ECHOCARDIOGRAM COMPLETE
P 1/2 time: 430 msec
S' Lateral: 4.5 cm
Weight: 3696 oz

## 2021-02-02 MED ORDER — PERFLUTREN LIPID MICROSPHERE
1.0000 mL | INTRAVENOUS | Status: AC | PRN
  Administered 2021-02-02: 2 mL via INTRAVENOUS

## 2021-02-02 NOTE — Patient Instructions (Signed)
Keep up the good work with your exercise Please add 2-3 days of strength training  I will call you tomorrow with your lab results and medication changes  Please call me at [phone] with any questions

## 2021-02-02 NOTE — Progress Notes (Signed)
Patient ID: Jonathon Price                 DOB: [birth year]                      MRN: [id number]     HPI: Jonathon Price is a 65 y.o. male referred by Dr. Eden Emms to pharmacy clinic for HF medication management. PMH is significant for afib, HTN, OSA. Most recent LVEF 25-30% on 02/26/19. At last visit with Dr. Eden Emms, losartan was stopped and Entresto 24/26mg  BID was started.  Today he presents to the pharmacy clinic for further medication titration. Symptomatically, he is feeling great, denies dizziness, lightheadedness, and fatigue. No chest pain or palpitations. No SOB. Able to complete all ADLs. Activity level high. He only checks his weight at home once a month. No LEE, PND, or orthopnea. Appetite has been good. He adheres to a low-salt diet. Blood pressure at home is in the high one tens to 120's/70's.  Current CHF meds: Entresto 24/26mg  twice a day, metoprolol succinate 50mg  daily Previously tried: losartan (changed to ) BP goal: <130/80  Family History:  Family History  Problem Relation Age of Onset   Breast cancer Mother    CAD Neg Hx    Atrial fibrillation Neg Hx    Colon cancer Neg Hx    Prostatitis Neg Hx    Diabetes Neg Hx    Social History: never smoked, no etoh   Diet: doesn't use much salt or eat processed foods  Exercise: daily walks 6-7.5 miles per day  Home BP readings: 117-120/70's in the AM 120's/70's in PM HR 76-78  Wt Readings from Last 3 Encounters:  01/12/21 231 lb (104.8 kg)  08/11/18 257 lb 12.8 oz (116.9 kg)  02/15/17 270 lb 4 oz (122.6 kg)   BP Readings from Last 3 Encounters:  01/12/21 128/80  08/11/18 (!) 176/82  02/15/17 126/74   Pulse Readings from Last 3 Encounters:  01/12/21 97  08/11/18 70  02/15/17 79    Renal function: CrCl cannot be calculated (Patient's most recent lab result is older than the maximum 21 days allowed.).  Past Medical History:  Diagnosis Date   A-fib [facility])    Hypertension     Current  Outpatient Medications on File Prior to Visit  Medication Sig Dispense Refill   metFORMIN (GLUCOPHAGE) 500 MG tablet Take 500 mg by mouth 2 (two) times daily.     metoprolol succinate (TOPROL-XL) 50 MG 24 hr tablet Take 50 mg by mouth 2 (two) times daily.     sacubitril-valsartan (ENTRESTO) 24-26 MG Take 1 tablet by mouth 2 (two) times daily. 60 tablet 11   XARELTO 20 MG TABS tablet TAKE 1 TABLET (20 MG TOTAL) BY MOUTH DAILY WITH SUPPER. 90 tablet 1   No current facility-administered medications on file prior to visit.    No Known Allergies   Assessment/Plan:  1. CHF - Blood pressure is stable. Patient had BMP drawn today. If stable will plan to increase Entresto to 49/51mg . Discussed cost and coverage gap with patient. Appears ok with cost. We discussed the 4 pillars of HF. Patient is asymptomatic. Will plan to titrate Entresto and metoprolol and consider adding spironolactone in the future. I will call patient tomorrow with lab results, review changes and schedule follow up. I encouraged patient to add some strength training to his exercise routine.  Thank you,  [facility], Pharm.D, BCPS, CPP [facility]  [place]. [address], [place], [place] [address]  Phone: [phone]; Fax: [phone]

## 2021-02-03 ENCOUNTER — Telehealth: Payer: Self-pay | Admitting: Pharmacist

## 2021-02-03 LAB — BASIC METABOLIC PANEL
BUN/Creatinine Ratio: 18 (ref 10–24)
BUN: 19 mg/dL (ref 8–27)
CO2: 22 mmol/L (ref 20–29)
Calcium: 9.6 mg/dL (ref 8.6–10.2)
Chloride: 103 mmol/L (ref 96–106)
Creatinine, Ser: 1.05 mg/dL (ref 0.76–1.27)
Glucose: 131 mg/dL — ABNORMAL HIGH (ref 65–99)
Potassium: 4.3 mmol/L (ref 3.5–5.2)
Sodium: 139 mmol/L (ref 134–144)
eGFR: 78 mL/min/{1.73_m2} (ref >59)

## 2021-02-03 NOTE — Telephone Encounter (Signed)
BMP stable after starting Entresto. Will increase Entresto to 49/51mg  BID. LVM to pt to call back to review. Patient will need f/u apt scheduled.

## 2021-02-04 NOTE — Telephone Encounter (Signed)
Left detailed VM per DPR but requested pt to call back to confirm pharmacy and schedule follow up

## 2021-02-05 MED ORDER — ENTRESTO 49-51 MG PO TABS: 1.0000 | ORAL_TABLET | Freq: Two times a day (BID) | ORAL | 11 refills | Status: DC

## 2021-02-05 NOTE — Telephone Encounter (Signed)
Spoke to patient. Rx sent to pharmacy and follow up scheduled for 8/22

## 2021-02-16 ENCOUNTER — Telehealth: Payer: Self-pay | Admitting: Cardiovascular Disease

## 2021-02-16 NOTE — Telephone Encounter (Signed)
Pt c/o medication issue:  1. Name of Medication: sacubitril-valsartan (ENTRESTO) 49-51 MG  2. How are you currently taking this medication (dosage and times per day)? Patient started the higher dose of the medication on Thursday 02/12/21. He has not taken the medication since yesterday morning  3. Are you having a reaction (difficulty breathing--STAT)? Low BP 8/11 4. What is your medication issue? Pt thinks the higher dosage of the medication is causing a drop in BP  Pt c/o BP issue: STAT if pt c/o blurred vision, one-sided weakness or slurred speech  1. What are your last 5 BP readings?  02/17/21: 117/71 ( no meds since morning of 02/15/21) 02/15/21 : 77/40 HR 47 02/14/21: 101/53   2. Are you having any other symptoms (ex. Dizziness, headache, blurred vision, passed out)? Pt was dizzy yesterday  3. What is your BP issue? Pt thinks the higher dosage of entresto is causing his symptoms

## 2021-02-17 NOTE — Telephone Encounter (Signed)
Called pt back. Reports he took higher dose of Entresto starting 8/11. Low BP started on 8/13 with reading of 101/53. Felt dizzy on 8/14 and BP was very low at 77/40. Rechecked it about every hour that day, most systolic BP remained < 100, eventually increased back up to 105/50-60 by the evening. He started taking 1 tablet of Entresto 49-51mg  once daily instead.  Advised pt to decrease back to Entresto 24-26mg  BID and keep follow up appt with Melissa on 8/22. He doesn't have any of the lower tablet strength left. Advised him to cut current dose in half until appt next week (not ideal to do long term given uneven distribution of sacubitril and valsartan in each pill, but distribution should be close enough for the short-term and will allow pt to use up some of current supply of med before he has appt next week). He will continue to monitor BP at home and call with any other concerns before appt next week.

## 2021-02-23 ENCOUNTER — Ambulatory Visit: Payer: Medicare Other

## 2021-03-10 ENCOUNTER — Ambulatory Visit: Payer: Medicare Other

## 2021-04-10 ENCOUNTER — Ambulatory Visit: Payer: Medicare Other | Admitting: Medical

## 2021-04-13 NOTE — Progress Notes (Signed)
CARDIOLOGY CONSULT NOTE       Patient ID: SID GREENER MRN: [id number] DOB/AGE: Nov 29, 1955 65 y.o.  Referring Physician: [facility]  Primary Physician: Pcp, No Primary Cardiologist: I have not seen since 2018 Reason for Consultation: Afib/Cardiomyopathy   HPI:  65 y.o. with history of chronic afib. Now on xarelto. 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 Seen by NP 02/15/17 and note indicates being asymptomatic and patient prefers to adopt strategy of rate control and anticoagulation ? 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 with no palpitations , syncope , chest pain or dyspnea   Tolerating mid dose EntrestoBMET normal 02/02/21   TTE 02/02/21 EF 35-40% Moderate bi atrial enlargement trivial MR AV sclerosis And aortic root 4.1 cm   He appears to have mostly post prandial elevation in BS Discussed changing  Glucophage to Jardiance for his low EF   He is trying to get back in with Dr Drue Novel for his primary care needs    ROS All other systems reviewed and negative except as noted above  Past Medical History:  Diagnosis Date   A-fib (HCC)    Hypertension     Family History  Problem Relation Age of Onset   Breast cancer Mother    CAD Neg Hx    Atrial fibrillation Neg Hx    Colon cancer Neg Hx    Prostatitis Neg Hx    Diabetes Neg Hx      Social History   Socioeconomic History   Marital status: Married    Spouse name: Not on file   Number of children: 2   Years of education: Not on file   Highest education level: Not on file  Occupational History   Occupation: Emergency planning/management officer   Tobacco Use   Smoking status: Never   Smokeless tobacco: Never  Substance and Sexual Activity   Alcohol use: No    Alcohol/week: 0.0 standard drinks   Drug use: No   Sexual activity: Yes  Other Topics Concern   Not on file  Social History Narrative   Home is [place], works in [employer] most of the time , travels to different states in an RV   Social Determinants of Corporate investment banker Strain: Not on file  Food Insecurity: Not on file  Transportation Needs: Not on file  Physical Activity: Not on file  Stress: Not on file  Social Connections: Not on file  Intimate Partner Violence: Not on file    Past Surgical History:  Procedure Laterality Date   right ankle operation  12 31 1978      Current Outpatient Medications:    metFORMIN (GLUCOPHAGE) 500 MG tablet, Take 500 mg by mouth 2 (two) times daily., Disp: , Rfl:    metoprolol succinate (TOPROL-XL) 50 MG 24  hr tablet, Take 50 mg by mouth 2 (two) times daily., Disp: , Rfl:    sacubitril-valsartan (ENTRESTO) 49-51 MG, Take 0.5 tablets by mouth 2 (two) times daily., Disp: , Rfl:    XARELTO 20 MG TABS tablet, TAKE 1 TABLET (20 MG TOTAL) BY MOUTH DAILY WITH SUPPER., Disp: 90 tablet, Rfl: 1    Physical Exam: There were no vitals taken for this visit.    Affect appropriate Healthy:  appears stated age HEENT: normal Neck supple with no adenopathy JVP normal no bruits no thyromegaly Lungs clear with no wheezing and good diaphragmatic motion Heart:  S1/S2 no murmur, no rub, gallop or click PMI normal Abdomen: benighn, BS positve, no tenderness, no AAA no bruit.  No HSM or HJR Distal pulses intact with no bruits No edema Neuro non-focal Skin warm and dry No muscular  weakness   Labs:   Lab Results  Component Value Date   WBC 7.9 02/15/2017   HGB 17.2 (H) 02/15/2017   HCT 50.8 02/15/2017   MCV 91.1 02/15/2017   PLT 214.0 02/15/2017   No results for input(s): NA, K, CL, CO2, BUN, CREATININE, CALCIUM, PROT, BILITOT, ALKPHOS, ALT, AST, GLUCOSE in the last 168 hours.  Invalid input(s): LABALBU No results found for: CKTOTAL, CKMB, CKMBINDEX, TROPONINI  Lab Results  Component Value Date   CHOL 169 03/28/2015   Lab Results  Component Value Date   HDL 43.50 03/28/2015   Lab Results  Component Value Date   LDLCALC 108 (H) 03/28/2015   Lab Results  Component Value Date   TRIG 88.0 03/28/2015   Lab Results  Component Value Date   CHOLHDL 4 03/28/2015   No results found for: LDLDIRECT    Radiology: No results found.  EKG: afib nonspecific ST changes    ASSESSMENT AND PLAN:   AFib:  rate control is fine on Xarelto TTE  chronic  Cardiomyopathy:  no significant CAD by cath in [place] 02/2019 Now on low dose Entresto TTE 02/02/21 EF 35-40% No AICD at this time  Anticoagulation:  Currently on xarelto CHADVASC 2 for age and HTN  DM:  On glucophage Discussed Jardiance  10 mg daily and Farxiga in regard to secondary Rx for his DCM  A1c 5.7 Will start 10 mg daily   Jardiance 10 mg F/U 3 months    Signed: Charlton Haws 04/13/2021, 3:49 PM

## 2021-04-17 ENCOUNTER — Other Ambulatory Visit: Payer: Self-pay

## 2021-04-17 ENCOUNTER — Ambulatory Visit: Payer: Medicare Other | Admitting: Cardiovascular Disease

## 2021-04-17 ENCOUNTER — Encounter: Payer: Self-pay | Admitting: Cardiovascular Disease

## 2021-04-17 VITALS — BP 140/78 | HR 78 | Resp 16 | Ht 72.0 in | Wt 233.8 lb

## 2021-04-17 DIAGNOSIS — Z7901 Long term (current) use of anticoagulants: Secondary | ICD-10-CM

## 2021-04-17 DIAGNOSIS — I5020 Unspecified systolic (congestive) heart failure: Secondary | ICD-10-CM

## 2021-04-17 DIAGNOSIS — I429 Cardiomyopathy, unspecified: Secondary | ICD-10-CM

## 2021-04-17 DIAGNOSIS — I482 Chronic atrial fibrillation, unspecified: Secondary | ICD-10-CM

## 2021-04-17 MED ORDER — ENTRESTO 49-51 MG PO TABS: 1.0000 | ORAL_TABLET | Freq: Two times a day (BID) | ORAL | 11 refills | Status: DC

## 2021-04-17 MED ORDER — EMPAGLIFLOZIN 10 MG PO TABS: 10.0000 mg | ORAL_TABLET | Freq: Every day | ORAL | 11 refills | Status: DC

## 2021-04-17 NOTE — Patient Instructions (Signed)
Medication Instructions:  Your physician has recommended you make the following change in your medication:  1-STOP Metformin 2-START Jardiance 10 mg by mouth daily.  *If you need a refill on your cardiac medications before your next appointment, please call your pharmacy*  Lab Work: If you have labs (blood work) drawn today and your tests are completely normal, you will receive your results only by: MyChart Message (if you have MyChart) OR A paper copy in the mail If you have any lab test that is abnormal or we need to change your treatment, we will call you to review the results.  Testing/Procedures: None ordered today.  Follow-Up: At [facility], you and your health needs are our priority.  As part of our continuing mission to provide you with exceptional heart care, we have created designated Provider Care Teams.  These Care Teams include your primary Cardiologist (physician) and Advanced Practice Providers (APPs -  Physician Assistants and Nurse Practitioners) who all work together to provide you with the care you need, when you need it.  We recommend signing up for the patient portal called "MyChart".  Sign up information is provided on this After Visit Summary.  MyChart is used to connect with patients for Virtual Visits (Telemedicine).  Patients are able to view lab/test results, encounter notes, upcoming appointments, etc.  Non-urgent messages can be sent to your provider as well.   To learn more about what you can do with MyChart, go to [email or web address].    Your next appointment:   3 month(s)  The format for your next appointment:   In Person  Provider:   You may see Charlton Haws, MD or one of the following Advanced Practice Providers on your designated Care Team:   Nada Boozer, NP

## 2021-04-20 ENCOUNTER — Telehealth: Payer: Self-pay | Admitting: Internal Medicine

## 2021-04-20 NOTE — Telephone Encounter (Signed)
Needs to reestablish with the practice, please arrange new patient office visit

## 2021-04-21 NOTE — Telephone Encounter (Signed)
LMOM asking for Pt to call back- okay to schedule sooner w/ Dr. Drue Novel if so desired.

## 2021-04-21 NOTE — Telephone Encounter (Signed)
Pt is already scheduled w/ Edward on 06/04/21 at 8:20am

## 2021-04-21 NOTE — Telephone Encounter (Signed)
Okay to switch to me if patient desires

## 2021-05-07 ENCOUNTER — Telehealth: Payer: Self-pay | Admitting: Pharmacist

## 2021-05-07 MED ORDER — EMPAGLIFLOZIN 10 MG PO TABS
10.0000 mg | ORAL_TABLET | Freq: Every day | ORAL | 3 refills | Status: DC
Start: 2021-05-07 — End: 2021-05-22

## 2021-05-07 MED ORDER — METFORMIN HCL 500 MG PO TABS: 500.0000 mg | ORAL_TABLET | Freq: Two times a day (BID) | ORAL | 1 refills | Status: DC

## 2021-05-07 NOTE — Telephone Encounter (Signed)
Patient called reporting that since he stopped taking his metformin and started Jardiance his Blood sugars have been  >100. Today BG was 200. He states prior when he was on metformin alone his BG was 80-low 100's.  I advised that he resume metformin at 500mg  daily. He may increase to BID as tolerated and if needed. Continue jardiance 10mg  daily.

## 2021-05-22 ENCOUNTER — Telehealth: Payer: Self-pay | Admitting: Cardiovascular Disease

## 2021-05-22 MED ORDER — EMPAGLIFLOZIN 10 MG PO TABS: 10.0000 mg | ORAL_TABLET | Freq: Every day | ORAL | 3 refills | Status: DC

## 2021-05-22 NOTE — Telephone Encounter (Signed)
 *  STAT* If patient is at the pharmacy, call can be transferred to refill team.   1. Which medications need to be refilled? (please list name of each medication and dose if known)   empagliflozin (JARDIANCE) 10 MG TABS tablet    2. Which pharmacy/location (including street and city if local pharmacy) is medication to be sent to? [id number] - [place], [place] - [address]  3. Do they need a 30 day or 90 day supply? 90 days   Pt said he no longer using this pharmacy [facility] [address] [place] - [address] And request to delete it from his preferred pharmacy

## 2021-05-22 NOTE — Telephone Encounter (Signed)
Pt's medication was sent to pt's pharmacy as requested. Confirmation received.  °

## 2021-06-04 ENCOUNTER — Other Ambulatory Visit: Payer: Self-pay

## 2021-06-04 ENCOUNTER — Ambulatory Visit (INDEPENDENT_AMBULATORY_CARE_PROVIDER_SITE_OTHER): Payer: Medicare Other | Admitting: Medical

## 2021-06-04 VITALS — BP 127/76 | HR 81 | Temp 97.9°F | Resp 18 | Ht 73.0 in | Wt 231.0 lb

## 2021-06-04 DIAGNOSIS — I4891 Unspecified atrial fibrillation: Secondary | ICD-10-CM

## 2021-06-04 DIAGNOSIS — R350 Frequency of micturition: Secondary | ICD-10-CM

## 2021-06-04 DIAGNOSIS — I5020 Unspecified systolic (congestive) heart failure: Secondary | ICD-10-CM | POA: Diagnosis not present

## 2021-06-04 DIAGNOSIS — I10 Essential (primary) hypertension: Secondary | ICD-10-CM

## 2021-06-04 DIAGNOSIS — E119 Type 2 diabetes mellitus without complications: Secondary | ICD-10-CM | POA: Diagnosis not present

## 2021-06-04 LAB — COMPREHENSIVE METABOLIC PANEL
ALT: 11 U/L (ref 0–53)
AST: 15 U/L (ref 0–37)
Albumin: 4.3 g/dL (ref 3.5–5.2)
Alkaline Phosphatase: 43 U/L (ref 39–117)
BUN: 17 mg/dL (ref 6–23)
CO2: 31 mEq/L (ref 19–32)
Calcium: 9.7 mg/dL (ref 8.4–10.5)
Chloride: 104 mEq/L (ref 96–112)
Creatinine, Ser: 1.16 mg/dL (ref 0.40–1.50)
GFR: 65.19 mL/min (ref >60.00)
Glucose, Bld: 95 mg/dL (ref 70–99)
Potassium: 5.1 mEq/L (ref 3.5–5.1)
Sodium: 140 mEq/L (ref 135–145)
Total Bilirubin: 0.8 mg/dL (ref 0.2–1.2)
Total Protein: 6.8 g/dL (ref 6.0–8.3)

## 2021-06-04 LAB — LIPID PANEL
Cholesterol: 218 mg/dL — ABNORMAL HIGH (ref 0–200)
HDL: 53.6 mg/dL (ref >39.00)
LDL Cholesterol: 143 mg/dL — ABNORMAL HIGH (ref 0–99)
NonHDL: 164.15
Total CHOL/HDL Ratio: 4
Triglycerides: 106 mg/dL (ref 0.0–149.0)
VLDL: 21.2 mg/dL (ref 0.0–40.0)

## 2021-06-04 LAB — PSA: PSA: 2.73 ng/mL (ref 0.10–4.00)

## 2021-06-04 NOTE — Progress Notes (Signed)
Subjective:    Patient ID: Jonathon Price, male    DOB: October 01, 1955, 65 y.o.   MRN: [id number]  HPI  Pt in for first time. Reitred but consulting still. Pt formerly seen by Dr. Larose Kells. Pt states he lives in [place]. Outside [place]. He states back and forth. He will be her for next 18 months and prefers . He will be in area 8-9 months a year most likely in future. Pt exercises 3 days a week. Some cardio/walks a lot 4-4.5 miles a day. Rides bicycle. Eating healthy. Nonsmoker. No alcohol.  Pt cardiologist local.  Cardiomyopathy and atrial fibrillation. Pt on entresto, toprol and xarelto.  Diabetic pt. His sugar is about 103 recently per a1c ordered by Dr. Johnsie Cancel. On mefformin and jardiance. After this month will dc metformin. Checks sugar 3 times a day.  Pt had 2 initial covid vaccines. Won't get done quicker.   Review of Systems  Constitutional:  Negative for chills, fatigue and fever.  Respiratory:  Negative for cough, chest tightness, shortness of breath and wheezing.   Cardiovascular:  Negative for chest pain and palpitations.  Gastrointestinal:  Negative for abdominal pain, diarrhea and nausea.  Genitourinary:  Positive for frequency. Negative for dysuria and flank pain.       Mild frequency  Musculoskeletal:  Negative for back pain and joint swelling.  Neurological:  Negative for dizziness, speech difficulty, weakness, numbness and headaches.  Hematological:  Negative for adenopathy. Does not bruise/bleed easily.  Psychiatric/Behavioral:  Negative for behavioral problems and confusion.     Past Medical History:  Diagnosis Date   A-fib ([place])    Hypertension      Social History   Socioeconomic History   Marital status: Married    Spouse name: Not on file   Number of children: 2   Years of education: Not on file   Highest education level: Not on file  Occupational History   Occupation: Government social research officer   Tobacco Use   Smoking status: Never   Smokeless tobacco:  Never  Substance and Sexual Activity   Alcohol use: No    Alcohol/week: 0.0 standard drinks   Drug use: No   Sexual activity: Yes  Other Topics Concern   Not on file  Social History Narrative   Home is [place], works in [employer] most of the time , travels to different states in an [place] Strain: Not on Art therapist Insecurity: Not on file  Transportation Needs: Not on file  Physical Activity: Not on file  Stress: Not on file  Social Connections: Not on file  Intimate Partner Violence: Not on file    Past Surgical History:  Procedure Laterality Date   right ankle operation  12 31 1978    Family History  Problem Relation Age of Onset   Breast cancer Mother    CAD Neg Hx    Atrial fibrillation Neg Hx    Colon cancer Neg Hx    Prostatitis Neg Hx    Diabetes Neg Hx     No Known Allergies  Current Outpatient Medications on File Prior to Visit  Medication Sig Dispense Refill   empagliflozin (JARDIANCE) 10 MG TABS tablet Take 1 tablet (10 mg total) by mouth daily before breakfast. 90 tablet 3   metFORMIN (GLUCOPHAGE) 500 MG tablet Take 1 tablet (500 mg total) by mouth 2 (two) times daily with a meal. 180 tablet 1   metoprolol succinate (TOPROL-XL)  50 MG 24 hr tablet Take 50 mg by mouth 2 (two) times daily.     sacubitril-valsartan (ENTRESTO) 49-51 MG Take 1 tablet by mouth 2 (two) times daily. 60 tablet 11   XARELTO 20 MG TABS tablet TAKE 1 TABLET (20 MG TOTAL) BY MOUTH DAILY WITH SUPPER. 90 tablet 1   No current facility-administered medications on file prior to visit.    BP 127/76   Pulse 81   Temp 97.9 F (36.6 C)   Resp 18   Ht 6\' 1"  (1.854 m)   Wt 231 lb (104.8 kg)   SpO2 100%   BMI 30.48 kg/m       Objective:   Physical Exam  General Mental Status- Alert. General Appearance- Not in acute distress.   Skin General: Color- Normal Color. Moisture- Normal Moisture.  Neck Carotid Arteries- Normal color.  Moisture- Normal Moisture. No carotid bruits. No JVD.  Chest and Lung Exam Auscultation: Breath Sounds:-Normal.  Cardiovascular Auscultation:Rythm- Regular. Murmurs & Other Heart Sounds:Auscultation of the heart reveals- No Murmurs.  Abdomen Inspection:-Inspeection Normal. Palpation/Percussion:Note:No mass. Palpation and Percussion of the abdomen reveal- Non Tender, Non Distended + BS, no rebound or guarding.  Neurologic Cranial Nerve exam:- CN III-XII intact(No nystagmus), symmetric smile. Strength:- 5/5 equal and symmetric strength both upper and lower extremities.       Assessment & Plan:   Patient Instructions  Htn- Bp well controlled today. Continue toprol xl.  Atrial fibrillation and HF. Continue entresto and xarelto.  Diabetes well controlled with recent a1c. Agree with plan to use jardiance and dc metformin. If sugar spike up when metformin stopped let me know.  Mid frequent urination. Will get psa today.  Follow up in 3 month or sooner if needed.   , PA-C

## 2021-06-04 NOTE — Patient Instructions (Addendum)
Htn- Bp well controlled today. Continue toprol xl.  Atrial fibrillation and HF. Continue entresto and xarelto.  Diabetes well controlled with recent a1c. Agree with plan to use jardiance and dc metformin. If sugar spike up when metformin stopped let me know.  Mid frequent urination. Will get psa today.  Follow up in 3 month or sooner if needed.

## 2021-07-23 ENCOUNTER — Ambulatory Visit: Payer: Medicare Other | Admitting: Cardiovascular Disease

## 2021-10-13 NOTE — Progress Notes (Signed)
CARDIOLOGY CONSULT NOTE  ? ? ? ? ? ?Patient ID: ?Jonathon Price ?MRN: [id number] ?DOB/AGE: 1955-11-04 66 y.o. ? ?Referring Physician: [facility]  ?Primary Physician: [name], PA-C ?Primary Cardiologist: I have not seen since 2018 ?Reason for Consultation: Afib/Cardiomyopathy ? ? ?HPI:  66 y.o. with history of chronic afib. Now on xarelto. 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 Seen by NP 02/15/17 and note indicates being asymptomatic and patient prefers to adopt strategy of rate control and anticoagulation ? Home sleep study in [place] and told he didn't need CPAP Intolerant to beta blockers and did not like being on cardizem at higher dose 300 mg daily In short cardiology care in [place] has been sporadic and hampered by patients travel, home in [place] and travel for vacation and work  ? 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 He like to RV. New project moving a company from [place] to [place] ? ?Functional class one with no palpitations , syncope , chest pain or dyspnea  ? ?Tolerating mid dose Entresto  ? ?TTE 02/02/21 EF 35-40% Moderate bi atrial enlargement trivial MR AV sclerosis And aortic root 4.1 cm  ? ?He appears to have mostly post prandial elevation in BS Now on Jardiance and glucophage  ? ?He is trying to get back in with Dr Paz/ Saguier for his primary care needs  ? ? ? ?ROS ?All other systems reviewed and negative except as noted above ? ?Past Medical History:  ?Diagnosis Date  ? A-fib ([facility])   ? Hypertension   ?  ?Family History  ?Problem Relation Age of Onset  ? Breast cancer  Mother   ? CAD Neg Hx   ? Atrial fibrillation Neg Hx   ? Colon cancer Neg Hx   ? Prostatitis Neg Hx   ? Diabetes Neg Hx   ?  ?Social History  ? ?Socioeconomic History  ? Marital status: Married  ?  Spouse name: Not on file  ? Number of children: 2  ? Years of education: Not on file  ? Highest education level: Not on file  ?Occupational History  ? Occupation: Emergency planning/management officer   ?Tobacco Use  ? Smoking status: Never  ? Smokeless tobacco: Never  ?Substance and Sexual Activity  ? Alcohol use: No  ?  Alcohol/week: 0.0 standard drinks  ? Drug use: No  ? Sexual activity: Yes  ?Other Topics Concern  ? Not on file  ?Social History Narrative  ? Home is [place], works in [employer] most of the time , travels to different states in an RV  ? ?Social Determinants of Health  ? ?Financial Resource Strain: Not on file  ?Food Insecurity: Not on file  ?Transportation Needs: Not on file  ?Physical Activity: Not on file  ?Stress: Not on file  ?Social Connections: Not on file  ?Intimate Partner Violence: Not on file  ?  ?Past Surgical History:  ?Procedure Laterality Date  ? right ankle operation  12 31 1978  ?  ? ? ?Current Outpatient Medications:  ?  empagliflozin (JARDIANCE) 10 MG TABS  tablet, Take 1 tablet (10 mg total) by mouth daily before breakfast., Disp: 90 tablet, Rfl: 3 ?  metFORMIN (GLUCOPHAGE) 500 MG tablet, Take 1 tablet (500 mg total) by mouth 2 (two) times daily with a meal., Disp: 180 tablet, Rfl: 1 ?  metoprolol succinate (TOPROL-XL) 50 MG 24 hr tablet, Take 50 mg by mouth 2 (two) times daily., Disp: , Rfl:  ?  sacubitril-valsartan (ENTRESTO) 49-51 MG, Take 1 tablet by mouth 2 (two) times daily., Disp: 60 tablet, Rfl: 11 ?  XARELTO 20 MG TABS tablet, TAKE 1 TABLET (20 MG TOTAL) BY MOUTH DAILY WITH SUPPER., Disp: 90 tablet, Rfl: 1 ? ? ? ?Physical Exam: ?Blood pressure 120/72, pulse 75, height 6' (1.829 m), weight 232 lb (105.2 kg), SpO2 99 %.   ? ?Affect appropriate ?Healthy:  appears stated age ?HEENT: normal ?Neck supple with  no adenopathy ?JVP normal no bruits no thyromegaly ?Lungs clear with no wheezing and good diaphragmatic motion ?Heart:  S1/S2 no murmur, no rub, gallop or click ?PMI normal ?Abdomen: benighn, BS positve, no tenderness, no AAA ?no bruit.  No HSM or HJR ?Distal pulses intact with no bruits ?No edema ?Neuro non-focal ?Skin warm and dry ?No muscular weakness ? ? ?Labs: ?  ?Lab Results  ?Component Value Date  ? WBC 7.9 02/15/2017  ? HGB 17.2 (H) 02/15/2017  ? HCT 50.8 02/15/2017  ? MCV 91.1 02/15/2017  ? PLT 214.0 02/15/2017  ? No results for input(s): NA, K, CL, CO2, BUN, CREATININE, CALCIUM, PROT, BILITOT, ALKPHOS, ALT, AST, GLUCOSE in the last 168 hours. ? ?Invalid input(s): LABALBU ?No results found for: CKTOTAL, CKMB, CKMBINDEX, TROPONINI  ?Lab Results  ?Component Value Date  ? CHOL 218 (H) 06/04/2021  ? CHOL 169 03/28/2015  ? ?Lab Results  ?Component Value Date  ? HDL 53.60 06/04/2021  ? HDL 43.50 03/28/2015  ? ?Lab Results  ?Component Value Date  ? LDLCALC 143 (H) 06/04/2021  ? LDLCALC 108 (H) 03/28/2015  ? ?Lab Results  ?Component Value Date  ? TRIG 106.0 06/04/2021  ? TRIG 88.0 03/28/2015  ? ?Lab Results  ?Component Value Date  ? CHOLHDL 4 06/04/2021  ? CHOLHDL 4 03/28/2015  ? ?No results found for: LDLDIRECT  ?  ?Radiology: ?No results found. ? ?EKG: afib nonspecific ST changes  ? ? ?ASSESSMENT AND PLAN:  ? ?AFib:  rate control is fine on Xarelto  chronic  ?Cardiomyopathy:  no significant CAD by cath in [place] 02/2019 Now on low dose Entresto TTE 02/02/21 EF 35-40% No AICD at this time  ?Anticoagulation:  Currently on xarelto CHADVASC 2 for age and HTN  ?DM:  On glucophage and Jardiance A1c 5.7  ? ? ?F/U in a year  ? ?Signed: ?Jenkins Rouge ?10/20/2021, 8:00 AM ? ? ?

## 2021-10-14 ENCOUNTER — Telehealth: Payer: Self-pay | Admitting: *Deleted

## 2021-10-14 NOTE — Telephone Encounter (Signed)
Letter received from [employer] stating not going to cover Jardiance  10 mg  Per pt is unaware of this at this time . Will call and see if need to do prior auth for med./cy ?

## 2021-10-14 NOTE — Telephone Encounter (Signed)
Spoke with [employer] rep and was informed no prior auth required ./cy ?

## 2021-10-20 ENCOUNTER — Ambulatory Visit: Payer: Medicare Other | Admitting: Cardiovascular Disease

## 2021-10-20 ENCOUNTER — Encounter: Payer: Self-pay | Admitting: Cardiovascular Disease

## 2021-10-20 VITALS — BP 120/72 | HR 75 | Ht 72.0 in | Wt 232.0 lb

## 2021-10-20 DIAGNOSIS — I482 Chronic atrial fibrillation, unspecified: Secondary | ICD-10-CM | POA: Diagnosis not present

## 2021-10-20 DIAGNOSIS — I5020 Unspecified systolic (congestive) heart failure: Secondary | ICD-10-CM | POA: Diagnosis not present

## 2021-10-20 DIAGNOSIS — I429 Cardiomyopathy, unspecified: Secondary | ICD-10-CM

## 2021-10-20 DIAGNOSIS — Z7901 Long term (current) use of anticoagulants: Secondary | ICD-10-CM

## 2021-10-20 NOTE — Patient Instructions (Signed)
Medication Instructions:  NO CHANGES *If you need a refill on your cardiac medications before your next appointment, please call your pharmacy*   Lab Work: NONE If you have labs (blood work) drawn today and your tests are completely normal, you will receive your results only by: MyChart Message (if you have MyChart) OR A paper copy in the mail If you have any lab test that is abnormal or we need to change your treatment, we will call you to review the results.   Testing/Procedures: NONE   Follow-Up: At [facility], you and your health needs are our priority.  As part of our continuing mission to provide you with exceptional heart care, we have created designated Provider Care Teams.  These Care Teams include your primary Cardiologist (physician) and Advanced Practice Providers (APPs -  Physician Assistants and Nurse Practitioners) who all work together to provide you with the care you need, when you need it.  We recommend signing up for the patient portal called "MyChart".  Sign up information is provided on this After Visit Summary.  MyChart is used to connect with patients for Virtual Visits (Telemedicine).  Patients are able to view lab/test results, encounter notes, upcoming appointments, etc.  Non-urgent messages can be sent to your provider as well.   To learn more about what you can do with MyChart, go to [email or web address].    Your next appointment:   1 year(s)  The format for your next appointment:   In Person  Provider:   Peter Nishan, MD     Other Instructions NONE  Important Information About Sugar       

## 2021-10-26 ENCOUNTER — Ambulatory Visit (INDEPENDENT_AMBULATORY_CARE_PROVIDER_SITE_OTHER): Payer: Medicare Other

## 2021-10-26 VITALS — Ht 72.0 in | Wt 232.0 lb

## 2021-10-26 DIAGNOSIS — Z0000 Encounter for general adult medical examination without abnormal findings: Secondary | ICD-10-CM | POA: Diagnosis not present

## 2021-10-26 NOTE — Progress Notes (Addendum)
? ?Subjective:  ? Jonathon Price is a 66 y.o. male who presents for an Initial Medicare Annual Wellness Visit. ? ?I connected with Lynell today by telephone and verified that I am speaking with the correct person using two identifiers. ?Location patient: home ?Location provider: work ?Persons participating in the virtual visit: patient, nurse.  ?  ?I discussed the limitations, risks, security and privacy concerns of performing an evaluation and management service by telephone and the availability of in person appointments. I also discussed with the patient that there may be a patient responsible charge related to this service. The patient expressed understanding and verbally consented to this telephonic visit.  ?  ?Interactive audio and video telecommunications were attempted between this provider and patient, however failed, due to patient having technical difficulties OR patient did not have access to video capability.  We continued and completed visit with audio only. ? ?Some vital signs may be absent or patient reported.  ? ?Time Spent with patient on telephone encounter: 25 minutes ? ? ?Review of Systems    ? ?Cardiac Risk Factors include: advanced age (>52men, >61 women);male gender;hypertension;obesity (BMI >30kg/m2) ? ?   ?Objective:  ?  ?Today's Vitals  ? 10/26/21 1430  ?Weight: 232 lb (105.2 kg)  ?Height: 6' (1.829 m)  ? ?Body mass index is 31.46 kg/m?. ? ? ?  10/26/2021  ?  2:33 PM  ?Advanced Directives  ?Does Patient Have a Medical Advance Directive? No  ? ? ?Current Medications (verified) ?Outpatient Encounter Medications as of 10/26/2021  ?Medication Sig  ? empagliflozin (JARDIANCE) 10 MG TABS tablet Take 1 tablet (10 mg total) by mouth daily before breakfast.  ? metFORMIN (GLUCOPHAGE) 500 MG tablet Take 1 tablet (500 mg total) by mouth 2 (two) times daily with a meal.  ? metoprolol succinate (TOPROL-XL) 50 MG 24 hr tablet Take 50 mg by mouth 2 (two) times daily.  ? sacubitril-valsartan (ENTRESTO)  49-51 MG Take 1 tablet by mouth 2 (two) times daily.  ? XARELTO 20 MG TABS tablet TAKE 1 TABLET (20 MG TOTAL) BY MOUTH DAILY WITH SUPPER.  ? ?No facility-administered encounter medications on file as of 10/26/2021.  ? ? ?Allergies (verified) ?Patient has no known allergies.  ? ?History: ?Past Medical History:  ?Diagnosis Date  ? A-fib ([facility])   ? Hypertension   ? ?Past Surgical History:  ?Procedure Laterality Date  ? right ankle operation  12 31 1978  ? ?Family History  ?Problem Relation Age of Onset  ? Breast cancer Mother   ? CAD Neg Hx   ? Atrial fibrillation Neg Hx   ? Colon cancer Neg Hx   ? Prostatitis Neg Hx   ? Diabetes Neg Hx   ? ?Social History  ? ?Socioeconomic History  ? Marital status: Married  ?  Spouse name: Not on file  ? Number of children: 2  ? Years of education: Not on file  ? Highest education level: Not on file  ?Occupational History  ? Occupation: Emergency planning/management officer   ?Tobacco Use  ? Smoking status: Never  ? Smokeless tobacco: Never  ?Substance and Sexual Activity  ? Alcohol use: No  ?  Alcohol/week: 0.0 standard drinks  ? Drug use: No  ? Sexual activity: Yes  ?Other Topics Concern  ? Not on file  ?Social History Narrative  ? Home is [place], works in [employer] most of the time , travels to different states in an RV  ? ?Social Determinants of Health  ? ?Physicist, medical  Strain: Low Risk   ? Difficulty of Paying Living Expenses: Not hard at all  ?Food Insecurity: No Food Insecurity  ? Worried About Programme researcher, broadcasting/film/video in the Last Year: Never true  ? Ran Out of Food in the Last Year: Never true  ?Transportation Needs: No Transportation Needs  ? Lack of Transportation (Medical): No  ? Lack of Transportation (Non-Medical): No  ?Physical Activity: Sufficiently Active  ? Days of Exercise per Week: 7 days  ? Minutes of Exercise per Session: 30 min  ?Stress: No Stress Concern Present  ? Feeling of Stress : Not at all  ?Social Connections: Moderately Integrated  ? Frequency of Communication with Friends and  Family: More than three times a week  ? Frequency of Social Gatherings with Friends and Family: More than three times a week  ? Attends Religious Services: More than 4 times per year  ? Active Member of Clubs or Organizations: No  ? Attends Banker Meetings: Never  ? Marital Status: Married  ? ? ?Tobacco Counseling ?Counseling given: Not Answered ? ? ?Clinical Intake: ? ?Pre-visit preparation completed: Yes ? ?Pain : No/denies pain ? ?  ? ?BMI - recorded: 31.46 ?Nutritional Status: BMI > 30  Obese ?Nutritional Risks: None ?Diabetes: No ? ?How often do you need to have someone help you when you read instructions, pamphlets, or other written materials from your doctor or pharmacy?: 1 - Never ? ?Diabetic?No ? ?Interpreter Needed?: No ? ?Information entered by :: Thomasenia Sales LPN ? ? ?Activities of Daily Living ? ?  10/26/2021  ?  2:37 PM 06/04/2021  ?  8:16 AM  ?In your present state of health, do you have any difficulty performing the following activities:  ?Hearing? 1 0  ?Comment lef ear   ?Vision? 0 0  ?Difficulty concentrating or making decisions? 0 0  ?Walking or climbing stairs? 0 0  ?Dressing or bathing? 0 0  ?Doing errands, shopping? 0 0  ?Preparing Food and eating ? N   ?Using the Toilet? N   ?In the past six months, have you accidently leaked urine? N   ?Do you have problems with loss of bowel control? N   ?Managing your Medications? N   ?Managing your Finances? N   ?Housekeeping or managing your Housekeeping? N   ? ? ?Patient Care Team: ?Saguier, Kateri Mc as PCP - General (Internal Medicine) ?Wendall Stade, MD as PCP - Cardiology (Cardiology) ? ?Indicate any recent Medical Services you may have received from other than [facility] providers in the past year (date may be approximate). ? ?   ?Assessment:  ? This is a routine wellness examination for Jonathon Price. ? ?Hearing/Vision screen ?Hearing Screening - Comments:: Patient states he has hearing loss in left ear ?Vision Screening - Comments:: Last  eye exam-10/2020-Dr. Valarie Merino ? ?Dietary issues and exercise activities discussed: ?Current Exercise Habits: Home exercise routine, Type of exercise: walking, Time (Minutes): 30, Frequency (Times/Week): 7, Weekly Exercise (Minutes/Week): 210, Intensity: Mild, Exercise limited by: None identified ? ? Goals Addressed   ? ?  ?  ?  ?  ? This Visit's Progress  ?  Patient Stated     ?  Maintain current health & lose 10-15 pounds ?  ? ?  ? ?Depression Screen ? ?  10/26/2021  ?  2:37 PM 06/04/2021  ?  8:16 AM 02/15/2017  ?  2:26 PM 03/26/2015  ?  3:40 PM  ?PHQ 2/9 Scores  ?PHQ - 2 Score 0 0  0 0  ?  ?Fall Risk ? ?  10/26/2021  ?  2:35 PM 06/04/2021  ?  8:16 AM 02/15/2017  ?  2:26 PM 03/26/2015  ?  3:40 PM  ?Fall Risk   ?Falls in the past year? 0 0 No No  ?Number falls in past yr: 0 0    ?Injury with Fall? 0 0    ?Follow up Falls prevention discussed     ? ? ?FALL RISK PREVENTION PERTAINING TO THE HOME: ? ?Any stairs in or around the home? No  ?Home free of loose throw rugs in walkways, pet beds, electrical cords, etc? Yes  ?Adequate lighting in your home to reduce risk of falls? Yes  ? ?ASSISTIVE DEVICES UTILIZED TO PREVENT FALLS: ? ?Life alert? No  ?Use of a cane, walker or w/c? No  ?Grab bars in the bathroom? No  ?Shower chair or bench in shower? Yes  ?Elevated toilet seat or a handicapped toilet? No  ? ?TIMED UP AND GO: ? ?Was the test performed? No . Phone visit ? ? ?Cognitive Function:No cognitive impairment noted. ?  ?  ?  ? ?Immunizations ?Immunization History  ?Administered Date(s) Administered  ? PFIZER(Purple Top)SARS-COV-2 Vaccination 08/24/2019, 09/14/2019  ? Tdap 07/05/2014  ? ? ?TDAP status: Up to date ? ?Flu Vaccine status: Due, Education has been provided regarding the importance of this vaccine. Advised may receive this vaccine at local pharmacy or Health Dept. Aware to provide a copy of the vaccination record if obtained from local pharmacy or Health Dept. Verbalized acceptance and understanding. ? ?Pneumococcal  vaccine status: Declined,  Education has been provided regarding the importance of this vaccine but patient still declined. Advised may receive this vaccine at local pharmacy or Health Dept. Aware to provide

## 2021-10-26 NOTE — Patient Instructions (Signed)
Mr. Dannemiller , ?Thank you for taking time to complete your Medicare Wellness Visit. I appreciate your ongoing commitment to your health goals. Please review the following plan we discussed and let me know if I can assist you in the future.  ? ?Screening recommendations/referrals: ?Colonoscopy: Completed 05/13/2015-Due 05/12/2025 ?Recommended yearly ophthalmology/optometry visit for glaucoma screening and checkup ?Recommended yearly dental visit for hygiene and checkup ? ?Vaccinations: ?Influenza vaccine: Declined ?Pneumococcal vaccine: Due-May obtain vaccine at our office or your local pharmacy. ?Tdap vaccine: Up to date ?Shingles vaccine: Due-May obtain vaccine at your local pharmacy. ?Covid-19: Booster available at the pharmacy ? ?Advanced directives: May obtain forms at our office. ? ?Conditions/risks identified: See problem list ? ?Next appointment: Follow up in one year for your annual wellness visit.  ? ?Preventive Care 56 Years and Older, Male ?Preventive care refers to lifestyle choices and visits with your health care provider that can promote health and wellness. ?What does preventive care include? ?A yearly physical exam. This is also called an annual well check. ?Dental exams once or twice a year. ?Routine eye exams. Ask your health care provider how often you should have your eyes checked. ?Personal lifestyle choices, including: ?Daily care of your teeth and gums. ?Regular physical activity. ?Eating a healthy diet. ?Avoiding tobacco and drug use. ?Limiting alcohol use. ?Practicing safe sex. ?Taking low doses of aspirin every day. ?Taking vitamin and mineral supplements as recommended by your health care provider. ?What happens during an annual well check? ?The services and screenings done by your health care provider during your annual well check will depend on your age, overall health, lifestyle risk factors, and family history of disease. ?Counseling  ?Your health care provider may ask you questions  about your: ?Alcohol use. ?Tobacco use. ?Drug use. ?Emotional well-being. ?Home and relationship well-being. ?Sexual activity. ?Eating habits. ?History of falls. ?Memory and ability to understand (cognition). ?Work and work Astronomer. ?Screening  ?You may have the following tests or measurements: ?Height, weight, and BMI. ?Blood pressure. ?Lipid and cholesterol levels. These may be checked every 5 years, or more frequently if you are over 23 years old. ?Skin check. ?Lung cancer screening. You may have this screening every year starting at age 1 if you have a 30-pack-year history of smoking and currently smoke or have quit within the past 15 years. ?Fecal occult blood test (FOBT) of the stool. You may have this test every year starting at age 80. ?Flexible sigmoidoscopy or colonoscopy. You may have a sigmoidoscopy every 5 years or a colonoscopy every 10 years starting at age 5. ?Prostate cancer screening. Recommendations will vary depending on your family history and other risks. ?Hepatitis C blood test. ?Hepatitis B blood test. ?Sexually transmitted disease (STD) testing. ?Diabetes screening. This is done by checking your blood sugar (glucose) after you have not eaten for a while (fasting). You may have this done every 1-3 years. ?Abdominal aortic aneurysm (AAA) screening. You may need this if you are a current or former smoker. ?Osteoporosis. You may be screened starting at age 10 if you are at high risk. ?Talk with your health care provider about your test results, treatment options, and if necessary, the need for more tests. ?Vaccines  ?Your health care provider may recommend certain vaccines, such as: ?Influenza vaccine. This is recommended every year. ?Tetanus, diphtheria, and acellular pertussis (Tdap, Td) vaccine. You may need a Td booster every 10 years. ?Zoster vaccine. You may need this after age 29. ?Pneumococcal 13-valent conjugate (PCV13) vaccine. One dose is  recommended after age 4. ?Pneumococcal  polysaccharide (PPSV23) vaccine. One dose is recommended after age 61. ?Talk to your health care provider about which screenings and vaccines you need and how often you need them. ?This information is not intended to replace advice given to you by your health care provider. Make sure you discuss any questions you have with your health care provider. ?Document Released: 07/18/2015 Document Revised: 03/10/2016 Document Reviewed: 04/22/2015 ?Elsevier Interactive Patient Education ? 2017 [place]. ? ?Fall Prevention in the Home ?Falls can cause injuries. They can happen to people of all ages. There are many things you can do to make your home safe and to help prevent falls. ?What can I do on the outside of my home? ?Regularly fix the edges of walkways and driveways and fix any cracks. ?Remove anything that might make you trip as you walk through a door, such as a raised step or threshold. ?Trim any bushes or trees on the path to your home. ?Use bright outdoor lighting. ?Clear any walking paths of anything that might make someone trip, such as rocks or tools. ?Regularly check to see if handrails are loose or broken. Make sure that both sides of any steps have handrails. ?Any raised decks and porches should have guardrails on the edges. ?Have any leaves, snow, or ice cleared regularly. ?Use sand or salt on walking paths during winter. ?Clean up any spills in your garage right away. This includes oil or grease spills. ?What can I do in the bathroom? ?Use night lights. ?Install grab bars by the toilet and in the tub and shower. Do not use towel bars as grab bars. ?Use non-skid mats or decals in the tub or shower. ?If you need to sit down in the shower, use a plastic, non-slip stool. ?Keep the floor dry. Clean up any water that spills on the floor as soon as it happens. ?Remove soap buildup in the tub or shower regularly. ?Attach bath mats securely with double-sided non-slip rug tape. ?Do not have throw rugs and other  things on the floor that can make you trip. ?What can I do in the bedroom? ?Use night lights. ?Make sure that you have a light by your bed that is easy to reach. ?Do not use any sheets or blankets that are too big for your bed. They should not hang down onto the floor. ?Have a firm chair that has side arms. You can use this for support while you get dressed. ?Do not have throw rugs and other things on the floor that can make you trip. ?What can I do in the kitchen? ?Clean up any spills right away. ?Avoid walking on wet floors. ?Keep items that you use a lot in easy-to-reach places. ?If you need to reach something above you, use a strong step stool that has a grab bar. ?Keep electrical cords out of the way. ?Do not use floor polish or wax that makes floors slippery. If you must use wax, use non-skid floor wax. ?Do not have throw rugs and other things on the floor that can make you trip. ?What can I do with my stairs? ?Do not leave any items on the stairs. ?Make sure that there are handrails on both sides of the stairs and use them. Fix handrails that are broken or loose. Make sure that handrails are as long as the stairways. ?Check any carpeting to make sure that it is firmly attached to the stairs. Fix any carpet that is loose or worn. ?Avoid having  throw rugs at the top or bottom of the stairs. If you do have throw rugs, attach them to the floor with carpet tape. ?Make sure that you have a light switch at the top of the stairs and the bottom of the stairs. If you do not have them, ask someone to add them for you. ?What else can I do to help prevent falls? ?Wear shoes that: ?Do not have high heels. ?Have rubber bottoms. ?Are comfortable and fit you well. ?Are closed at the toe. Do not wear sandals. ?If you use a stepladder: ?Make sure that it is fully opened. Do not climb a closed stepladder. ?Make sure that both sides of the stepladder are locked into place. ?Ask someone to hold it for you, if possible. ?Clearly  mark and make sure that you can see: ?Any grab bars or handrails. ?First and last steps. ?Where the edge of each step is. ?Use tools that help you move around (mobility aids) if they are needed. These inclu

## 2021-11-03 ENCOUNTER — Ambulatory Visit (INDEPENDENT_AMBULATORY_CARE_PROVIDER_SITE_OTHER): Payer: Medicare Other | Admitting: Medical

## 2021-11-03 VITALS — BP 135/70 | HR 75 | Resp 18 | Ht 72.0 in | Wt 238.5 lb

## 2021-11-03 DIAGNOSIS — E785 Hyperlipidemia, unspecified: Secondary | ICD-10-CM | POA: Diagnosis not present

## 2021-11-03 DIAGNOSIS — I4891 Unspecified atrial fibrillation: Secondary | ICD-10-CM | POA: Diagnosis not present

## 2021-11-03 DIAGNOSIS — R739 Hyperglycemia, unspecified: Secondary | ICD-10-CM | POA: Diagnosis not present

## 2021-11-03 DIAGNOSIS — I10 Essential (primary) hypertension: Secondary | ICD-10-CM | POA: Diagnosis not present

## 2021-11-03 LAB — LIPID PANEL
Cholesterol: 224 mg/dL — ABNORMAL HIGH (ref 0–200)
HDL: 52.2 mg/dL (ref >39.00)
LDL Cholesterol: 138 mg/dL — ABNORMAL HIGH (ref 0–99)
NonHDL: 171.5
Total CHOL/HDL Ratio: 4
Triglycerides: 169 mg/dL — ABNORMAL HIGH (ref 0.0–149.0)
VLDL: 33.8 mg/dL (ref 0.0–40.0)

## 2021-11-03 LAB — COMPREHENSIVE METABOLIC PANEL
ALT: 11 U/L (ref 0–53)
AST: 14 U/L (ref 0–37)
Albumin: 4.4 g/dL (ref 3.5–5.2)
Alkaline Phosphatase: 44 U/L (ref 39–117)
BUN: 15 mg/dL (ref 6–23)
CO2: 30 mEq/L (ref 19–32)
Calcium: 9.6 mg/dL (ref 8.4–10.5)
Chloride: 104 mEq/L (ref 96–112)
Creatinine, Ser: 1.19 mg/dL (ref 0.40–1.50)
GFR: 63.04 mL/min (ref >60.00)
Glucose, Bld: 97 mg/dL (ref 70–99)
Potassium: 4.7 mEq/L (ref 3.5–5.1)
Sodium: 139 mEq/L (ref 135–145)
Total Bilirubin: 1 mg/dL (ref 0.2–1.2)
Total Protein: 6.9 g/dL (ref 6.0–8.3)

## 2021-11-03 NOTE — Patient Instructions (Addendum)
Htn- Bp well controlled today. Continue toprol xl. ?  ?Atrial fibrillation and HF.  Rate controlled. Continue entresto and xarelto. ? ?Elevated sugar in past.  I could not find a1c 6.5 or above. Continue Jardiance per cardiologist rx. ? ?For hyperlipidemia recommend low cholesterol diet and continue exercise. Discuss cardiovascular risk score and possible recommendation for statin or zetia. ? ?Follow up 6 months but possibly sooner after lab review. ?

## 2021-11-03 NOTE — Progress Notes (Signed)
? ?Subjective:  ? ? Patient ID: Jonathon Price, male    DOB: 28-Mar-1956, 66 y.o.   MRN: [id number] ? ?HPI ? ?Cardiomyopathy and atrial fibrillation. Pt on entresto, toprol and xarelto. Bp well controlled. Pulse controlled. ? ? ?Cardiologist evaluation on 10-20-21 ? ?ASSESSMENT AND PLAN:  ?  ?AFib:  rate control is fine on Xarelto  chronic  ?Cardiomyopathy:  no significant CAD by cath in [place] 02/2019 Now on low dose Entresto TTE 02/02/21 EF 35-40% No AICD at this time  ?Anticoagulation:  Currently on xarelto CHADVASC 2 for age and HTN  ?DM:  On glucophage and Jardiance A1c 5.7  ? ?Pt has some intermittent mild allergies after walking. Not major. Does walk daily. Pt does normal saline spray can. ? ? ?On review pt states random bs years ago was 131. ? ? ?Recently come out of retirement. Working 18-20 hours a week.  ? ?Review of Systems  ?Constitutional:  Negative for chills, fatigue and fever.  ?Respiratory:  Negative for cough, chest tightness, shortness of breath and wheezing.   ?Cardiovascular:  Negative for chest pain and palpitations.  ?Gastrointestinal:  Negative for abdominal pain.  ?Genitourinary:  Negative for dysuria, flank pain, frequency and genital sores.  ?Musculoskeletal:  Negative for back pain, myalgias, neck pain and neck stiffness.  ?Skin:  Negative for rash.  ?Neurological:  Negative for dizziness, syncope, weakness, numbness and headaches.  ?Hematological:  Negative for adenopathy. Does not bruise/bleed easily.  ?Psychiatric/Behavioral:  Negative for behavioral problems and decreased concentration.   ? ? ?Past Medical History:  ?Diagnosis Date  ? A-fib ([facility])   ? Hypertension   ? ?  ?Social History  ? ?Socioeconomic History  ? Marital status: Married  ?  Spouse name: Not on file  ? Number of children: 2  ? Years of education: Not on file  ? Highest education level: Not on file  ?Occupational History  ? Occupation: Government social research officer   ?Tobacco Use  ? Smoking status: Never  ? Smokeless tobacco: Never   ?Substance and Sexual Activity  ? Alcohol use: No  ?  Alcohol/week: 0.0 standard drinks  ? Drug use: No  ? Sexual activity: Yes  ?Other Topics Concern  ? Not on file  ?Social History Narrative  ? Home is [place], works in [employer] most of the time , travels to different states in an [place]  ? ?Social Determinants of Health  ? ?Financial Resource Strain: Low Risk   ? Difficulty of Paying Living Expenses: Not hard at all  ?Food Insecurity: No Food Insecurity  ? Worried About Charity fundraiser in the Last Year: Never true  ? Ran Out of Food in the Last Year: Never true  ?Transportation Needs: No Transportation Needs  ? Lack of Transportation (Medical): No  ? Lack of Transportation (Non-Medical): No  ?Physical Activity: Sufficiently Active  ? Days of Exercise per Week: 7 days  ? Minutes of Exercise per Session: 30 min  ?Stress: No Stress Concern Present  ? Feeling of Stress : Not at all  ?Social Connections: Moderately Integrated  ? Frequency of Communication with Friends and Family: More than three times a week  ? Frequency of Social Gatherings with Friends and Family: More than three times a week  ? Attends Religious Services: More than 4 times per year  ? Active Member of Clubs or Organizations: No  ? Attends Archivist Meetings: Never  ? Marital Status: Married  ?Intimate Partner Violence: Not At Risk  ?  Fear of Current or Ex-Partner: No  ? Emotionally Abused: No  ? Physically Abused: No  ? Sexually Abused: No  ? ? ?Past Surgical History:  ?Procedure Laterality Date  ? right ankle operation  12 31 1978  ? ? ?Family History  ?Problem Relation Age of Onset  ? Breast cancer Mother   ? CAD Neg Hx   ? Atrial fibrillation Neg Hx   ? Colon cancer Neg Hx   ? Prostatitis Neg Hx   ? Diabetes Neg Hx   ? ? ?No Known Allergies ? ?Current Outpatient Medications on File Prior to Visit  ?Medication Sig Dispense Refill  ? empagliflozin (JARDIANCE) 10 MG TABS tablet Take 1 tablet (10 mg total) by mouth daily before breakfast. 90  tablet 3  ? metoprolol succinate (TOPROL-XL) 50 MG 24 hr tablet Take 50 mg by mouth 2 (two) times daily.    ? sacubitril-valsartan (ENTRESTO) 49-51 MG Take 1 tablet by mouth 2 (two) times daily. 60 tablet 11  ? XARELTO 20 MG TABS tablet TAKE 1 TABLET (20 MG TOTAL) BY MOUTH DAILY WITH SUPPER. 90 tablet 1  ? ?No current facility-administered medications on file prior to visit.  ? ? ?BP 135/70   Pulse 75   Resp 18   Ht 6' (1.829 m)   Wt 238 lb 8 oz (108.2 kg)   SpO2 100%   BMI 32.35 kg/m?  ?  ?   ?Objective:  ? Physical Exam ? ?General ?Mental Status- Alert. General Appearance- Not in acute distress.  ? ?Skin ?General: Color- Normal Color. Moisture- Normal Moisture. ? ?Neck ?Carotid Arteries- Normal color. Moisture- Normal Moisture. No carotid bruits. No JVD. ? ?Chest and Lung Exam ?Auscultation: ?Breath Sounds:-Normal. ? ?Cardiovascular ?Auscultation:Rythm- Regular. ?Murmurs & Other Heart Sounds:Auscultation of the heart reveals- No Murmurs. ? ?Abdomen ?Inspection:-Inspeection Normal. ?Palpation/Percussion:Note:No mass. Palpation and Percussion of the abdomen reveal- Non Tender, Non Distended + BS, no rebound or guarding. ? ? ?Neurologic ?Cranial Nerve exam:- CN III-XII intact(No nystagmus), symmetric smile. ?Strength:- 5/5 equal and symmetric strength both upper and lower extremities.  ? ? ?   ?Assessment & Plan:  ? ?Patient Instructions  ?Htn- Bp well controlled today. Continue toprol xl. ?  ?Atrial fibrillation and HF. Continue entresto and xarelto. ? ?Elevated sugar in past.  I could not find a1c 6.5 or above. Continue Jardiance per cardiologist rx. ? ?For hyperlipidemia recommend low cholesterol diet and continue exercise. Discuss cardiovascular risk score and possible recommendation for statin or zetia. ? ?Follow up 6 months but possibly sooner after lab review.  ? ?Mackie Pai, PA-C  ?

## 2021-11-05 ENCOUNTER — Encounter: Payer: Self-pay | Admitting: Medical

## 2021-11-05 MED ORDER — ATORVASTATIN CALCIUM 10 MG PO TABS: 10.0000 mg | ORAL_TABLET | Freq: Every day | ORAL | 3 refills | Status: DC

## 2021-11-05 NOTE — Addendum Note (Signed)
Addended by: Gwenevere Abbot on: 11/05/2021 05:12 PM ? ? Modules accepted: Orders ? ?

## 2021-11-06 ENCOUNTER — Encounter: Payer: Self-pay | Admitting: Medical

## 2021-11-06 NOTE — Telephone Encounter (Signed)
Okay to order a1c and schedule patient? ?

## 2021-11-06 NOTE — Addendum Note (Signed)
Addended by: Gwenevere Abbot on: 11/06/2021 12:02 PM ? ? Modules accepted: Orders ? ?

## 2022-01-23 ENCOUNTER — Other Ambulatory Visit: Payer: Self-pay | Admitting: Cardiovascular Disease

## 2022-02-16 ENCOUNTER — Telehealth: Payer: Self-pay | Admitting: Medical

## 2022-02-16 MED ORDER — METOPROLOL SUCCINATE ER 50 MG PO TB24: 50.0000 mg | ORAL_TABLET | Freq: Two times a day (BID) | ORAL | 0 refills | Status: DC

## 2022-02-16 NOTE — Telephone Encounter (Signed)
Rx sent 

## 2022-02-16 NOTE — Telephone Encounter (Signed)
Medication:   metFORMIN (GLUCOPHAGE) 500 MG tablet [[id number]]   Has the patient contacted their pharmacy? No. (If no, request that the patient contact the pharmacy for the refill.) (If yes, when and what did the pharmacy advise?)  Preferred Pharmacy (with phone number or street name):   [id number] - [place], [place] - [address]  [address], [place] [address]  Phone:  [phone]  Fax:  [phone]   Agent: Please be advised that RX refills may take up to 3 business days. We ask that you follow-up with your pharmacy.

## 2022-02-17 ENCOUNTER — Telehealth: Payer: Self-pay | Admitting: Medical

## 2022-02-17 MED ORDER — METFORMIN HCL 500 MG PO TABS: 500.0000 mg | ORAL_TABLET | Freq: Two times a day (BID) | ORAL | 0 refills | Status: DC

## 2022-02-17 NOTE — Telephone Encounter (Signed)
Metformin rx sent to pharmacy

## 2022-04-01 ENCOUNTER — Other Ambulatory Visit: Payer: Self-pay | Admitting: Cardiovascular Disease

## 2022-04-01 NOTE — Telephone Encounter (Signed)
Prescription refill request for Xarelto received.  Indication:Afib Last office visit:4/23 Weight:108.2 kg Age:66 Scr:1.1 CrCl:98.36 ml/min  Prescription refilled

## 2022-04-03 ENCOUNTER — Other Ambulatory Visit: Payer: Self-pay | Admitting: Cardiovascular Disease

## 2022-04-05 MED ORDER — EMPAGLIFLOZIN 10 MG PO TABS: 10.0000 mg | ORAL_TABLET | Freq: Every day | ORAL | 1 refills | Status: DC

## 2022-05-12 ENCOUNTER — Encounter: Payer: Medicare Other | Admitting: Medical

## 2022-05-15 ENCOUNTER — Other Ambulatory Visit: Payer: Self-pay | Admitting: Medical

## 2022-05-17 ENCOUNTER — Encounter: Payer: Medicare Other | Admitting: Medical

## 2022-06-03 ENCOUNTER — Encounter: Payer: Medicare Other | Admitting: Medical

## 2022-06-14 ENCOUNTER — Ambulatory Visit (INDEPENDENT_AMBULATORY_CARE_PROVIDER_SITE_OTHER): Payer: Medicare Other | Admitting: Medical

## 2022-06-14 ENCOUNTER — Encounter: Payer: Self-pay | Admitting: Medical

## 2022-06-14 VITALS — BP 136/88 | HR 87 | Temp 98.0°F | Resp 18 | Ht 72.0 in | Wt 236.8 lb

## 2022-06-14 DIAGNOSIS — D75839 Thrombocytosis, unspecified: Secondary | ICD-10-CM

## 2022-06-14 DIAGNOSIS — R7989 Other specified abnormal findings of blood chemistry: Secondary | ICD-10-CM

## 2022-06-14 DIAGNOSIS — E785 Hyperlipidemia, unspecified: Secondary | ICD-10-CM

## 2022-06-14 DIAGNOSIS — I4891 Unspecified atrial fibrillation: Secondary | ICD-10-CM

## 2022-06-14 DIAGNOSIS — I10 Essential (primary) hypertension: Secondary | ICD-10-CM | POA: Diagnosis not present

## 2022-06-14 DIAGNOSIS — R739 Hyperglycemia, unspecified: Secondary | ICD-10-CM | POA: Diagnosis not present

## 2022-06-14 DIAGNOSIS — R350 Frequency of micturition: Secondary | ICD-10-CM

## 2022-06-14 DIAGNOSIS — I509 Heart failure, unspecified: Secondary | ICD-10-CM

## 2022-06-14 DIAGNOSIS — D751 Secondary polycythemia: Secondary | ICD-10-CM

## 2022-06-14 DIAGNOSIS — R0981 Nasal congestion: Secondary | ICD-10-CM

## 2022-06-14 LAB — LIPID PANEL
Cholesterol: 224 mg/dL — ABNORMAL HIGH (ref 0–200)
HDL: 54.5 mg/dL (ref >39.00)
LDL Cholesterol: 134 mg/dL — ABNORMAL HIGH (ref 0–99)
NonHDL: 169.27
Total CHOL/HDL Ratio: 4
Triglycerides: 174 mg/dL — ABNORMAL HIGH (ref 0.0–149.0)
VLDL: 34.8 mg/dL (ref 0.0–40.0)

## 2022-06-14 LAB — COMPREHENSIVE METABOLIC PANEL
ALT: 12 U/L (ref 0–53)
AST: 15 U/L (ref 0–37)
Albumin: 4.3 g/dL (ref 3.5–5.2)
Alkaline Phosphatase: 36 U/L — ABNORMAL LOW (ref 39–117)
BUN: 18 mg/dL (ref 6–23)
CO2: 26 mEq/L (ref 19–32)
Calcium: 9.6 mg/dL (ref 8.4–10.5)
Chloride: 105 mEq/L (ref 96–112)
Creatinine, Ser: 1 mg/dL (ref 0.40–1.50)
GFR: 77.34 mL/min (ref >60.00)
Glucose, Bld: 93 mg/dL (ref 70–99)
Potassium: 4.5 mEq/L (ref 3.5–5.1)
Sodium: 140 mEq/L (ref 135–145)
Total Bilirubin: 0.8 mg/dL (ref 0.2–1.2)
Total Protein: 6.8 g/dL (ref 6.0–8.3)

## 2022-06-14 LAB — HEMOGLOBIN A1C: Hgb A1c MFr Bld: 6.1 % (ref 4.6–6.5)

## 2022-06-14 LAB — PSA: PSA: 3.09 ng/mL (ref 0.10–4.00)

## 2022-06-14 MED ORDER — ATORVASTATIN CALCIUM 10 MG PO TABS: 10.0000 mg | ORAL_TABLET | Freq: Every day | ORAL | 3 refills | Status: DC

## 2022-06-14 NOTE — Addendum Note (Signed)
Addended by: Gwenevere Abbot on: 06/14/2022 05:22 PM   Modules accepted: Orders

## 2022-06-14 NOTE — Progress Notes (Signed)
Subjective:    Patient ID: Jonathon Price, male    DOB: Mar 04, 1956, 66 y.o.   MRN: [id number]  HPI  Pt in for follow up. Saw pt 6 month ago.  Below is summary from last visit in ".  "Htn- Bp well controlled today. Continue toprol xl.   Atrial fibrillation and HF. Continue entresto and xarelto.   Elevated sugar in past.  I could not find a1c 6.5 or above. Continue Jardiance per cardiologist rx.   For hyperlipidemia recommend low cholesterol diet and continue exercise. Discuss cardiovascular risk score and possible recommendation for statin or zetia."  Pt here for labs. State feels well except mild nasal congestion. States usually this time of year will get sudafed. In the past has tried flonase nasal spray. No infection signs/symptoms. In the past astelin does not help much.  Declines flu and pneumonia vaccine. Also declines rsv    Review of Systems  Constitutional:  Negative for chills, fatigue and fever.  HENT:  Positive for congestion. Negative for ear pain, mouth sores, nosebleeds, postnasal drip and rhinorrhea.   Respiratory:  Negative for cough, chest tightness and shortness of breath.   Cardiovascular:  Negative for chest pain and palpitations.  Gastrointestinal:  Negative for abdominal pain, blood in stool, constipation and nausea.  Genitourinary:  Negative for dysuria, flank pain and frequency.  Musculoskeletal:  Negative for back pain, joint swelling and neck pain.  Neurological:  Negative for dizziness, speech difficulty, weakness, numbness and headaches.  Hematological:  Negative for adenopathy. Does not bruise/bleed easily.  Psychiatric/Behavioral:  Negative for behavioral problems and decreased concentration.     Past Medical History:  Diagnosis Date   A-fib ([facility])    Hypertension      Social History   Socioeconomic History   Marital status: Married    Spouse name: Not on file   Number of children: 2   Years of education: Not on file   Highest  education level: Not on file  Occupational History   Occupation: Emergency planning/management officer   Tobacco Use   Smoking status: Never   Smokeless tobacco: Never  Substance and Sexual Activity   Alcohol use: No    Alcohol/week: 0.0 standard drinks of alcohol   Drug use: No   Sexual activity: Yes  Other Topics Concern   Not on file  Social History Narrative   Home is [place], works in [employer] most of the time , travels to different states in an RV   Social Determinants of Corporate investment banker Strain: Low Risk  (10/26/2021)   Overall Financial Resource Strain (CARDIA)    Difficulty of Paying Living Expenses: Not hard at all  Food Insecurity: No Food Insecurity (10/26/2021)   Hunger Vital Sign    Worried About Running Out of Food in the Last Year: Never true    Ran Out of Food in the Last Year: Never true  Transportation Needs: No Transportation Needs (10/26/2021)   PRAPARE - Administrator, Civil Service (Medical): No    Lack of Transportation (Non-Medical): No  Physical Activity: Sufficiently Active (10/26/2021)   Exercise Vital Sign    Days of Exercise per Week: 7 days    Minutes of Exercise per Session: 30 min  Stress: No Stress Concern Present (10/26/2021)   [employer] of Occupational Health - Occupational Stress Questionnaire    Feeling of Stress : Not at all  Social Connections: Moderately Integrated (10/26/2021)   Social Connection and Isolation Panel [NHANES]  Frequency of Communication with Friends and Family: More than three times a week    Frequency of Social Gatherings with Friends and Family: More than three times a week    Attends Religious Services: More than 4 times per year    Active Member of [employer] or Organizations: No    Attends Banker Meetings: Never    Marital Status: Married  Catering manager Violence: Not At Risk (10/26/2021)   Humiliation, Afraid, Rape, and Kick questionnaire    Fear of Current or Ex-Partner: No    Emotionally Abused:  No    Physically Abused: No    Sexually Abused: No    Past Surgical History:  Procedure Laterality Date   right ankle operation  12 31 1978    Family History  Problem Relation Age of Onset   Breast cancer Mother    CAD Neg Hx    Atrial fibrillation Neg Hx    Colon cancer Neg Hx    Prostatitis Neg Hx    Diabetes Neg Hx     No Known Allergies  Current Outpatient Medications on File Prior to Visit  Medication Sig Dispense Refill   empagliflozin (JARDIANCE) 10 MG TABS tablet Take 1 tablet (10 mg total) by mouth daily before breakfast. 90 tablet 1   ENTRESTO 49-51 MG TAKE 1 TABLET BY MOUTH TWICE A DAY 60 tablet 11   metFORMIN (GLUCOPHAGE) 500 MG tablet TAKE 1 TABLET BY MOUTH 2 TIMES DAILY WITH A MEAL. 180 tablet 0   rivaroxaban (XARELTO) 20 MG TABS tablet TAKE 1 TABLET BY MOUTH EVERY DAY 30 tablet 5   metoprolol succinate (TOPROL-XL) 50 MG 24 hr tablet Take 1 tablet (50 mg total) by mouth 2 (two) times daily. 90 tablet 0   No current facility-administered medications on file prior to visit.    BP 136/88   Pulse 87   Temp 98 F (36.7 C)   Resp 18   Ht 6' (1.829 m)   Wt 236 lb 12.8 oz (107.4 kg)   SpO2 96%   BMI 32.12 kg/m        Objective:   Physical Exam   General Mental Status- Alert. General Appearance- Not in acute distress.   Skin General: Color- Normal Color. Moisture- Normal Moisture.  Neck Carotid Arteries- Normal color. Moisture- Normal Moisture. No carotid bruits. No JVD.  Chest and Lung Exam Auscultation: Breath Sounds:-Normal.  Cardiovascular Auscultation:Rythm- Regular. Murmurs & Other Heart Sounds:Auscultation of the heart reveals- No Murmurs.    Neurologic Cranial Nerve exam:- CN III-XII intact(No nystagmus), symmetric smile. Strength:- 5/5 equal and symmetric strength both upper and lower extremities. Marland Kitchen   Heent- mild pnd, mild boggy turbinates. No sinus pressure.    Assessment & Plan:   Patient Instructions  Htn- bp still  controlled. Continue toprol xl daily.  Atrial fibrillation and HF. Continue entresto and xarelto.  For elevated sugar. Get A1c. Continue jardiance.  For high cholesterol get cmp and lipid panel.   For mild nasal congestion can try nasal saline spray. Mild dilemna since failed flonase and astelin. Caution on any sudafed use.   Follow up in approx 6 moths or sooner if needed.

## 2022-06-14 NOTE — Addendum Note (Signed)
Addended by: Gwenevere Abbot on: 06/14/2022 10:45 PM   Modules accepted: Orders

## 2022-06-14 NOTE — Patient Instructions (Addendum)
Htn- bp still controlled. Continue toprol xl daily.  Atrial fibrillation and HF(rate controlled and no signs/symptoms of chf flair). Continue entresto and xarelto.  For elevated sugar. Get A1c. Continue jardiance.  For high cholesterol get cmp and lipid panel.   For mild nasal congestion can try nasal saline spray. Mild dilemna since failed flonase and astelin. Caution on any sudafed use.   Follow up in approx 6 moths or sooner if needed.

## 2022-06-15 ENCOUNTER — Other Ambulatory Visit (INDEPENDENT_AMBULATORY_CARE_PROVIDER_SITE_OTHER): Payer: Medicare Other

## 2022-06-15 DIAGNOSIS — R7989 Other specified abnormal findings of blood chemistry: Secondary | ICD-10-CM

## 2022-06-15 LAB — CBC WITH DIFFERENTIAL/PLATELET
Basophils Absolute: 0.1 10*3/uL (ref 0.0–0.1)
Basophils Relative: 0.8 % (ref 0.0–3.0)
Eosinophils Absolute: 0.2 10*3/uL (ref 0.0–0.7)
Eosinophils Relative: 4.1 % (ref 0.0–5.0)
HCT: 52.4 % — ABNORMAL HIGH (ref 39.0–52.0)
Hemoglobin: 17.6 g/dL — ABNORMAL HIGH (ref 13.0–17.0)
Lymphocytes Relative: 27.8 % (ref 12.0–46.0)
Lymphs Abs: 1.7 10*3/uL (ref 0.7–4.0)
MCHC: 33.6 g/dL (ref 30.0–36.0)
MCV: 93 fl (ref 78.0–100.0)
Monocytes Absolute: 0.4 10*3/uL (ref 0.1–1.0)
Monocytes Relative: 6.8 % (ref 3.0–12.0)
Neutro Abs: 3.6 10*3/uL (ref 1.4–7.7)
Neutrophils Relative %: 60.5 % (ref 43.0–77.0)
Platelets: 202 10*3/uL (ref 150.0–400.0)
RBC: 5.63 Mil/uL (ref 4.22–5.81)
RDW: 14.2 % (ref 11.5–15.5)
WBC: 6 10*3/uL (ref 4.0–10.5)

## 2022-06-15 NOTE — Addendum Note (Signed)
Addended by: Mervin Kung A on: 06/15/2022 07:43 AM   Modules accepted: Orders

## 2022-06-16 NOTE — Addendum Note (Signed)
Addended by: Gwenevere Abbot on: 06/16/2022 07:00 AM   Modules accepted: Orders

## 2022-06-17 ENCOUNTER — Encounter: Payer: Self-pay | Admitting: Medical

## 2022-08-11 ENCOUNTER — Other Ambulatory Visit: Payer: Self-pay | Admitting: Medical

## 2022-08-15 ENCOUNTER — Other Ambulatory Visit: Payer: Self-pay | Admitting: Medical

## 2022-08-15 ENCOUNTER — Other Ambulatory Visit: Payer: Self-pay | Admitting: Cardiovascular Disease

## 2022-09-12 ENCOUNTER — Other Ambulatory Visit: Payer: Self-pay | Admitting: Cardiovascular Disease

## 2022-09-12 DIAGNOSIS — I482 Chronic atrial fibrillation, unspecified: Secondary | ICD-10-CM

## 2022-09-13 NOTE — Telephone Encounter (Signed)
Prescription refill request for Xarelto received.  Indication:Afib  Last office visit:  10/03/21 Jonathon Price)  Weight: 107.4kg Age: 67  Scr: 1.00 06/14/22 CrCl: 107.11m/min  Appropriate dose. Refill sent.

## 2022-10-16 ENCOUNTER — Other Ambulatory Visit: Payer: Self-pay | Admitting: Cardiovascular Disease

## 2022-10-18 ENCOUNTER — Telehealth: Payer: Self-pay | Admitting: Medical

## 2022-10-18 NOTE — Telephone Encounter (Signed)
Copied from CRM [id number]. Topic: Medicare AWV >> Oct 18, 2022 10:24 AM Payton Doughty wrote: Reason for CRM: Called patient to schedule Medicare Annual Wellness Visit (AWV). Left message for patient to call back and schedule Medicare Annual Wellness Visit (AWV).  Last date of AWV: 10/26/21  Please schedule an appointment at any time with Donne Anon, CMA  .  If any questions, please contact me.  Thank you ,  Verlee Rossetti; Care Guide Ambulatory Clinical Support [place] [facility] Direct Dial: [phone]

## 2022-11-08 ENCOUNTER — Ambulatory Visit: Payer: Medicare Other | Attending: Cardiovascular Disease | Admitting: Cardiovascular Disease

## 2022-11-08 ENCOUNTER — Encounter: Payer: Self-pay | Admitting: Cardiovascular Disease

## 2022-11-08 VITALS — BP 144/82 | HR 91 | Ht 72.0 in | Wt 237.6 lb

## 2022-11-08 DIAGNOSIS — I429 Cardiomyopathy, unspecified: Secondary | ICD-10-CM

## 2022-11-08 DIAGNOSIS — Z7901 Long term (current) use of anticoagulants: Secondary | ICD-10-CM

## 2022-11-08 DIAGNOSIS — I482 Chronic atrial fibrillation, unspecified: Secondary | ICD-10-CM | POA: Diagnosis not present

## 2022-11-08 LAB — BASIC METABOLIC PANEL
BUN: 16 mg/dL (ref 8–27)
Calcium: 9.6 mg/dL (ref 8.6–10.2)
Creatinine, Ser: 1.18 mg/dL (ref 0.76–1.27)
Potassium: 4.3 mmol/L (ref 3.5–5.2)

## 2022-11-08 LAB — CBC WITH DIFFERENTIAL/PLATELET

## 2022-11-08 NOTE — Progress Notes (Signed)
CARDIOLOGY CONSULT NOTE       Patient ID: Jonathon Price MRN: [id number] DOB/AGE: [birth year] 69 y.o.  Referring Physician: [facility]  Primary Physician: [name], PA-C Primary Cardiologist: I have not seen since 2018 Reason for Consultation: Afib/Cardiomyopathy   HPI:  67 y.o. with history of chronic afib. Now on xarelto. History of DCM presumed non ischemic followed by cardiology in [place] before Dr Riley Nearing did cath 02/26/19 with no significant CAD EF 25-30% TTE done at [facility] and Vascular 2020 EF 30-35% mild AR/MR Had discussions about [facility] but this was never arranged as patient does not live here and home in [place] and his company has a base her in [place]. Seen by NP 02/15/17 and note indicates being asymptomatic and patient prefers to adopt strategy of rate control and anticoagulation ? 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 He like to RV. New project moving a company from [place] to [place]  Functional class one with no palpitations , syncope , chest pain or dyspnea   Tolerating mid dose Entresto   TTE 02/02/21 EF 35-40% Moderate bi atrial enlargement trivial MR AV sclerosis And aortic root 4.1 cm   He appears to have mostly post prandial elevation in BS Now on Jardiance and glucophage   Sees  Saguier for his primary care needs No bleeding issues feels great     ROS All other systems reviewed and negative except as noted above  Past Medical History:  Diagnosis Date   A-fib (HCC)    Hypertension     Family History  Problem Relation Age of Onset   Breast cancer Mother     CAD Neg Hx    Atrial fibrillation Neg Hx    Colon cancer Neg Hx    Prostatitis Neg Hx    Diabetes Neg Hx     Social History   Socioeconomic History   Marital status: Married    Spouse name: Not on file   Number of children: 2   Years of education: Not on file   Highest education level: Not on file  Occupational History   Occupation: Emergency planning/management officer   Tobacco Use   Smoking status: Never   Smokeless tobacco: Never  Substance and Sexual Activity   Alcohol use: No    Alcohol/week: 0.0 standard drinks of alcohol   Drug use: No   Sexual activity: Yes  Other Topics Concern   Not on file  Social History Narrative   Home is [place], works in [employer] most of the time , travels to different states in an RV   Social Determinants of Corporate investment banker Strain: Low Risk  (10/26/2021)   Overall Financial Resource Strain (CARDIA)    Difficulty of Paying Living Expenses: Not hard at all  Food Insecurity: No Food Insecurity (10/26/2021)   Hunger Vital Sign    Worried About Running Out of Food in the Last Year: Never true    Ran Out of Food in the Last Year: Never true  Transportation Needs: No Transportation Needs (10/26/2021)   PRAPARE - Transportation  Lack of Transportation (Medical): No    Lack of Transportation (Non-Medical): No  Physical Activity: Sufficiently Active (10/26/2021)   Exercise Vital Sign    Days of Exercise per Week: 7 days    Minutes of Exercise per Session: 30 min  Stress: No Stress Concern Present (10/26/2021)   [employer] of Occupational Health - Occupational Stress Questionnaire    Feeling of Stress : Not at all  Social Connections: Moderately Integrated (10/26/2021)   Social Connection and Isolation Panel [NHANES]    Frequency of Communication with Friends and Family: More than three times a week    Frequency of Social Gatherings with Friends and Family: More than three times a week    Attends Religious Services: More than 4 times per year     Active Member of [employer] or Organizations: No    Attends Banker Meetings: Never    Marital Status: Married  Catering manager Violence: Not At Risk (10/26/2021)   Humiliation, Afraid, Rape, and Kick questionnaire    Fear of Current or Ex-Partner: No    Emotionally Abused: No    Physically Abused: No    Sexually Abused: No    Past Surgical History:  Procedure Laterality Date   right ankle operation  12 31 1978      Current Outpatient Medications:    empagliflozin (JARDIANCE) 10 MG TABS tablet, Take 1 tablet (10 mg total) by mouth daily. Pt.need appt.with Dr. Eden Emms to receive future refills. Thank You. 1st Attempt., Disp: 30 tablet, Rfl: 0   ENTRESTO 49-51 MG, TAKE 1 TABLET BY MOUTH TWICE A DAY, Disp: 60 tablet, Rfl: 11   metFORMIN (GLUCOPHAGE) 500 MG tablet, TAKE 1 TABLET BY MOUTH TWICE A DAY WITH FOOD, Disp: 180 tablet, Rfl: 0   metoprolol succinate (TOPROL-XL) 50 MG 24 hr tablet, Take 1 tablet (50 mg total) by mouth 2 (two) times daily., Disp: 90 tablet, Rfl: 0   XARELTO 20 MG TABS tablet, TAKE 1 TABLET BY MOUTH EVERY DAY, Disp: 30 tablet, Rfl: 5   atorvastatin (LIPITOR) 10 MG tablet, Take 1 tablet (10 mg total) by mouth daily. (Patient not taking: Reported on 11/08/2022), Disp: 30 tablet, Rfl: 3    Physical Exam: Blood pressure (!) 144/82, pulse 91, height 6' (1.829 m), weight 237 lb 9.6 oz (107.8 kg), SpO2 97 %.    Affect appropriate Healthy:  appears stated age HEENT: normal Neck supple with no adenopathy JVP normal no bruits no thyromegaly Lungs clear with no wheezing and good diaphragmatic motion Heart:  S1/S2 no murmur, no rub, gallop or click PMI normal Abdomen: benighn, BS positve, no tenderness, no AAA no bruit.  No HSM or HJR Distal pulses intact with no bruits No edema Neuro non-focal Skin warm and dry No muscular weakness   Labs:   Lab Results  Component Value Date   WBC 6.0 06/15/2022   HGB 17.6 (H) 06/15/2022   HCT 52.4 (H) 06/15/2022   MCV  93.0 06/15/2022   PLT 202.0 06/15/2022   No results for input(s): "NA", "K", "CL", "CO2", "BUN", "CREATININE", "CALCIUM", "PROT", "BILITOT", "ALKPHOS", "ALT", "AST", "GLUCOSE" in the last 168 hours.  Invalid input(s): "LABALBU" No results found for: "CKTOTAL", "CKMB", "CKMBINDEX", "TROPONINI"  Lab Results  Component Value Date   CHOL 224 (H) 06/14/2022   CHOL 224 (H) 11/03/2021   CHOL 218 (H) 06/04/2021   Lab Results  Component Value Date   HDL 54.50 06/14/2022   HDL 52.20 11/03/2021   HDL 53.60 06/04/2021  Lab Results  Component Value Date   LDLCALC 134 (H) 06/14/2022   LDLCALC 138 (H) 11/03/2021   LDLCALC 143 (H) 06/04/2021   Lab Results  Component Value Date   TRIG 174.0 (H) 06/14/2022   TRIG 169.0 (H) 11/03/2021   TRIG 106.0 06/04/2021   Lab Results  Component Value Date   CHOLHDL 4 06/14/2022   CHOLHDL 4 11/03/2021   CHOLHDL 4 06/04/2021   No results found for: "LDLDIRECT"    Radiology: No results found.  EKG: afib nonspecific ST changes 11/08/2022 rate 92   ASSESSMENT AND PLAN:   AFib:  rate control is fine on Xarelto  chronic CBC/BMET for blood thinner  Cardiomyopathy:  no significant CAD by cath in [place] 02/2019 Now on low dose Entresto TTE 02/02/21 EF 35-40% No AICD at this time Update TTE Anticoagulation:  Currently on xarelto CHADVASC 2 for age and HTN  DM:  On glucophage and Jardiance A1c 6.4  CBC/BMET TTE   F/U in a year   Signed: Charlton Haws 11/08/2022, 8:07 AM

## 2022-11-08 NOTE — Patient Instructions (Addendum)
Medication Instructions:  Your physician recommends that you continue on your current medications as directed. Please refer to the Current Medication list given to you today.  *If you need a refill on your cardiac medications before your next appointment, please call your pharmacy*  Lab Work: Your physician recommends that you have lab work today- BMET and CBC  If you have labs (blood work) drawn today and your tests are completely normal, you will receive your results only by: MyChart Message (if you have MyChart) OR A paper copy in the mail If you have any lab test that is abnormal or we need to change your treatment, we will call you to review the results.  Testing/Procedures: Your physician has requested that you have an echocardiogram. Echocardiography is a painless test that uses sound waves to create images of your heart. It provides your doctor with information about the size and shape of your heart and how well your heart's chambers and valves are working. This procedure takes approximately one hour. There are no restrictions for this procedure. Please do NOT wear cologne, perfume, aftershave, or lotions (deodorant is allowed). Please arrive 15 minutes prior to your appointment time.  Follow-Up: At [facility], you and your health needs are our priority.  As part of our continuing mission to provide you with exceptional heart care, we have created designated Provider Care Teams.  These Care Teams include your primary Cardiologist (physician) and Advanced Practice Providers (APPs -  Physician Assistants and Nurse Practitioners) who all work together to provide you with the care you need, when you need it.  We recommend signing up for the patient portal called "MyChart".  Sign up information is provided on this After Visit Summary.  MyChart is used to connect with patients for Virtual Visits (Telemedicine).  Patients are able to view lab/test results, encounter notes, upcoming  appointments, etc.  Non-urgent messages can be sent to your provider as well.   To learn more about what you can do with MyChart, go to [email or web address].    Your next appointment:   12 month(s)  Provider:   Charlton Haws, MD

## 2022-11-09 LAB — BASIC METABOLIC PANEL
BUN/Creatinine Ratio: 14 (ref 10–24)
CO2: 24 mmol/L (ref 20–29)
Chloride: 102 mmol/L (ref 96–106)
Glucose: 91 mg/dL (ref 70–99)
Sodium: 142 mmol/L (ref 134–144)
eGFR: 67 mL/min/{1.73_m2} (ref >59)

## 2022-11-09 LAB — CBC WITH DIFFERENTIAL/PLATELET
Basophils Absolute: 0 10*3/uL (ref 0.0–0.2)
Basos: 1 %
Eos: 4 %
Hematocrit: 53.6 % — ABNORMAL HIGH (ref 37.5–51.0)
Hemoglobin: 17.8 g/dL — ABNORMAL HIGH (ref 13.0–17.7)
Immature Grans (Abs): 0 10*3/uL (ref 0.0–0.1)
MCV: 92 fL (ref 79–97)
Monocytes Absolute: 0.6 10*3/uL (ref 0.1–0.9)
Monocytes: 8 %
Neutrophils Absolute: 4 10*3/uL (ref 1.4–7.0)
Neutrophils: 58 %
Platelets: 202 10*3/uL (ref 150–450)
RDW: 14.6 % (ref 11.6–15.4)
WBC: 6.8 10*3/uL (ref 3.4–10.8)

## 2022-11-11 NOTE — Addendum Note (Signed)
Addended by: Gwenevere Abbot on: 11/11/2022 06:17 AM   Modules accepted: Orders

## 2022-11-12 ENCOUNTER — Telehealth: Payer: Self-pay | Admitting: Medical

## 2022-11-12 NOTE — Telephone Encounter (Signed)
Pt called to find out why Jonathon Price referred him to the oncology and hematology provider but he isn't sure why  because he hasn't seen him in like 6 months. Spoke with Revonda Standard and she said the patient's cardiologist shared the results with him and he thought he should see a hematologist. Pt has appt next week

## 2022-11-16 ENCOUNTER — Inpatient Hospital Stay: Payer: Medicare Other | Admitting: Medical Oncology

## 2022-11-16 ENCOUNTER — Inpatient Hospital Stay: Payer: Medicare Other | Attending: Medical Oncology

## 2022-11-16 ENCOUNTER — Encounter: Payer: Self-pay | Admitting: Medical Oncology

## 2022-11-16 ENCOUNTER — Other Ambulatory Visit: Payer: Self-pay

## 2022-11-16 VITALS — BP 116/78 | HR 99 | Resp 19 | Ht 72.0 in | Wt 237.0 lb

## 2022-11-16 DIAGNOSIS — I509 Heart failure, unspecified: Secondary | ICD-10-CM | POA: Diagnosis not present

## 2022-11-16 DIAGNOSIS — Z803 Family history of malignant neoplasm of breast: Secondary | ICD-10-CM | POA: Insufficient documentation

## 2022-11-16 DIAGNOSIS — Z6835 Body mass index (BMI) 35.0-35.9, adult: Secondary | ICD-10-CM | POA: Insufficient documentation

## 2022-11-16 DIAGNOSIS — I110 Hypertensive heart disease with heart failure: Secondary | ICD-10-CM | POA: Diagnosis not present

## 2022-11-16 DIAGNOSIS — D751 Secondary polycythemia: Secondary | ICD-10-CM

## 2022-11-16 DIAGNOSIS — E119 Type 2 diabetes mellitus without complications: Secondary | ICD-10-CM

## 2022-11-16 DIAGNOSIS — I429 Cardiomyopathy, unspecified: Secondary | ICD-10-CM | POA: Insufficient documentation

## 2022-11-16 DIAGNOSIS — R161 Splenomegaly, not elsewhere classified: Secondary | ICD-10-CM

## 2022-11-16 LAB — CMP (CANCER CENTER ONLY)
ALT: 12 U/L (ref 0–44)
AST: 17 U/L (ref 15–41)
Albumin: 4 g/dL (ref 3.5–5.0)
Alkaline Phosphatase: 42 U/L (ref 38–126)
Anion gap: 8 (ref 5–15)
BUN: 17 mg/dL (ref 8–23)
CO2: 28 mmol/L (ref 22–32)
Calcium: 9 mg/dL (ref 8.9–10.3)
Chloride: 100 mmol/L (ref 98–111)
Creatinine: 1.19 mg/dL (ref 0.61–1.24)
GFR, Estimated: >60 mL/min (ref >60)
Glucose, Bld: 132 mg/dL — ABNORMAL HIGH (ref 70–99)
Potassium: 4.9 mmol/L (ref 3.5–5.1)
Sodium: 136 mmol/L (ref 135–145)
Total Bilirubin: 0.8 mg/dL (ref 0.3–1.2)
Total Protein: 7.3 g/dL (ref 6.5–8.1)

## 2022-11-16 LAB — CBC WITH DIFFERENTIAL (CANCER CENTER ONLY)
Abs Immature Granulocytes: 0.03 10*3/uL (ref 0.00–0.07)
Basophils Absolute: 0 10*3/uL (ref 0.0–0.1)
Basophils Relative: 1 %
Eosinophils Absolute: 0.3 10*3/uL (ref 0.0–0.5)
Eosinophils Relative: 4 %
HCT: 51.3 % (ref 39.0–52.0)
Hemoglobin: 17.4 g/dL — ABNORMAL HIGH (ref 13.0–17.0)
Immature Granulocytes: 0 %
Lymphocytes Relative: 29 %
Lymphs Abs: 2 10*3/uL (ref 0.7–4.0)
MCH: 31 pg (ref 26.0–34.0)
MCHC: 33.9 g/dL (ref 30.0–36.0)
MCV: 91.4 fL (ref 80.0–100.0)
Monocytes Absolute: 0.4 10*3/uL (ref 0.1–1.0)
Monocytes Relative: 6 %
Neutro Abs: 4.1 10*3/uL (ref 1.7–7.7)
Neutrophils Relative %: 60 %
Platelet Count: 181 10*3/uL (ref 150–400)
RBC: 5.61 MIL/uL (ref 4.22–5.81)
RDW: 14.3 % (ref 11.5–15.5)
WBC Count: 6.9 10*3/uL (ref 4.0–10.5)
nRBC: 0 % (ref 0.0–0.2)

## 2022-11-16 LAB — RETICULOCYTES
Immature Retic Fract: 8.8 % (ref 2.3–15.9)
RBC.: 5.6 MIL/uL (ref 4.22–5.81)
Retic Count, Absolute: 79 10*3/uL (ref 19.0–186.0)
Retic Ct Pct: 1.4 % (ref 0.4–3.1)

## 2022-11-16 LAB — FERRITIN: Ferritin: 86 ng/mL (ref 24–336)

## 2022-11-16 NOTE — Progress Notes (Signed)
[facility] [phone]   [phone]  INITIAL CONSULT NOTE  Patient Care Team: Saguier, Kateri Mc as PCP - General (Internal Medicine) Wendall Stade, MD as PCP - Cardiology (Cardiology)  Hematological/Oncological History # None  CHIEF COMPLAINTS/PURPOSE OF CONSULTATION:  "I am not sure "- Elevated hemoglobin levels.   HISTORY OF PRESENTING ILLNESS:  Jonathon Price 67 y.o. male who was referred to [place] by his PCP at suggestion of his Cardiologist for elevated Hgb.   This is new over the last few months per patient however I can see that as of 5 years ago his hemoglobin was mildly elevated at 17.2  Patient has a complex cardiac history of A. Fib, Mitral regugitation, aortic regurgitation, heart failure and HTN, DM2, hypercholesterolemia.  He does not take any vitamins or supplements.   He denies smoking history  No known COPD.   He does not snore.   He does not use testosterone supplementation.   He does often travel to locations above [place],000 elevation. Previously lived here but moved in November.   No history of thalassemia. No history of elevated iron levels.  No new headaches, SOB or fatigue.    MEDICAL HISTORY:  Past Medical History:  Diagnosis Date   A-fib [facility])    Hypertension     SURGICAL HISTORY: Past Surgical History:  Procedure Laterality Date   right ankle operation  12 31 1978    SOCIAL HISTORY: Social History   Socioeconomic History   Marital status: Married    Spouse name: Not on file   Number of children: 2   Years of education: Not on file   Highest education level: Not on file  Occupational History   Occupation: Emergency planning/management officer   Tobacco Use   Smoking status: Never   Smokeless tobacco: Never  Vaping Use   Vaping Use: Not on file  Substance and Sexual Activity   Alcohol use: No    Alcohol/week: 0.0 standard drinks of alcohol   Drug use: No   Sexual activity: Yes  Other Topics Concern    Not on file  Social History Narrative   Home is [place], works in [employer] most of the time , travels to different states in an RV   Social Determinants of Corporate investment banker Strain: Low Risk  (10/26/2021)   Overall Financial Resource Strain (CARDIA)    Difficulty of Paying Living Expenses: Not hard at all  Food Insecurity: No Food Insecurity (10/26/2021)   Hunger Vital Sign    Worried About Running Out of Food in the Last Year: Never true    Ran Out of Food in the Last Year: Never true  Transportation Needs: No Transportation Needs (10/26/2021)   PRAPARE - Administrator, Civil Service (Medical): No    Lack of Transportation (Non-Medical): No  Physical Activity: Sufficiently Active (10/26/2021)   Exercise Vital Sign    Days of Exercise per Week: 7 days    Minutes of Exercise per Session: 30 min  Stress: No Stress Concern Present (10/26/2021)   [employer] of Occupational Health - Occupational Stress Questionnaire    Feeling of Stress : Not at all  Social Connections: Moderately Integrated (10/26/2021)   Social Connection and Isolation Panel [NHANES]    Frequency of Communication with Friends and Family: More than three times a week    Frequency of Social Gatherings with Friends and Family: More than three times a week    Attends Religious Services:  More than 4 times per year    Active Member of Clubs or Organizations: No    Attends Banker Meetings: Never    Marital Status: Married  Catering manager Violence: Not At Risk (10/26/2021)   Humiliation, Afraid, Rape, and Kick questionnaire    Fear of Current or Ex-Partner: No    Emotionally Abused: No    Physically Abused: No    Sexually Abused: No    FAMILY HISTORY: Family History  Problem Relation Age of Onset   Breast cancer Mother    CAD Neg Hx    Atrial fibrillation Neg Hx    Colon cancer Neg Hx    Prostatitis Neg Hx    Diabetes Neg Hx     ALLERGIES:  has No Known  Allergies.  MEDICATIONS:  Current Outpatient Medications  Medication Sig Dispense Refill   atorvastatin (LIPITOR) 10 MG tablet Take 1 tablet (10 mg total) by mouth daily. 30 tablet 3   empagliflozin (JARDIANCE) 10 MG TABS tablet Take 1 tablet (10 mg total) by mouth daily. Pt.need appt.with Dr. Eden Emms to receive future refills. Thank You. 1st Attempt. 30 tablet 0   ENTRESTO 49-51 MG TAKE 1 TABLET BY MOUTH TWICE A DAY 60 tablet 11   metFORMIN (GLUCOPHAGE) 500 MG tablet TAKE 1 TABLET BY MOUTH TWICE A DAY WITH FOOD 180 tablet 0   XARELTO 20 MG TABS tablet TAKE 1 TABLET BY MOUTH EVERY DAY 30 tablet 5   metoprolol succinate (TOPROL-XL) 50 MG 24 hr tablet Take 1 tablet (50 mg total) by mouth 2 (two) times daily. 90 tablet 0   No current facility-administered medications for this visit.    REVIEW OF SYSTEMS:   Constitutional: ( - ) fevers, ( - )  chills , ( - ) night sweats Eyes: ( - ) blurriness of vision, ( - ) double vision, ( - ) watery eyes Ears, nose, mouth, throat, and face: ( - ) mucositis, ( - ) sore throat Respiratory: ( - ) cough, ( - ) dyspnea, ( - ) wheezes Cardiovascular: ( - ) palpitation, ( - ) chest discomfort, ( - ) lower extremity swelling Gastrointestinal:  ( - ) nausea, ( - ) heartburn, ( - ) change in bowel habits Skin: ( - ) abnormal skin rashes Lymphatics: ( - ) new lymphadenopathy, ( - ) easy bruising Neurological: ( - ) numbness, ( - ) tingling, ( - ) new weaknesses Behavioral/Psych: ( - ) mood change, ( - ) new changes  All other systems were reviewed with the patient and are negative.  PHYSICAL EXAMINATION: ECOG PERFORMANCE STATUS: 1 - Symptomatic but completely ambulatory  Vitals:   11/16/22 1300  BP: 116/78  Pulse: 99  Resp: 19  SpO2: 100%   Filed Weights   11/16/22 1300  Weight: 237 lb (107.5 kg)    GENERAL: well appearing male in NAD  SKIN: skin color, texture, turgor are normal, no rashes or significant lesions EYES: conjunctiva are pink and  non-injected, sclera clear OROPHARYNX: no exudate, no erythema; lips, buccal mucosa, and tongue normal  NECK: supple, non-tender LYMPH:  no palpable lymphadenopathy in the cervical, axillary or supraclavicular lymph nodes.  LUNGS: clear to auscultation and percussion with normal breathing effort HEART: regular rate & rhythm and no murmurs and no lower extremity edema ABDOMEN: soft, non-tender, non-distended, normal bowel sounds, palpable spleen Musculoskeletal: no cyanosis of digits and no clubbing  PSYCH: alert & oriented x 3, fluent speech NEURO: no focal motor/sensory deficits  LABORATORY DATA:  I have reviewed the data as listed    Latest Ref Rng & Units 11/16/2022    1:51 PM 11/08/2022    8:22 AM 06/15/2022    9:13 AM  CBC  WBC 4.0 - 10.5 K/uL 6.9  6.8  6.0   Hemoglobin 13.0 - 17.0 g/dL 44.0  34.7  42.5   Hematocrit 39.0 - 52.0 % 51.3  53.6  52.4   Platelets 150 - 400 K/uL 181  202  202.0        Latest Ref Rng & Units 11/08/2022    8:22 AM 06/14/2022    8:28 AM 11/03/2021    9:14 AM  CMP  Glucose 70 - 99 mg/dL 91  93  97   BUN 8 - 27 mg/dL 16  18  15    Creatinine 0.76 - 1.27 mg/dL 9.56  3.87  5.64   Sodium 134 - 144 mmol/L 142  140  139   Potassium 3.5 - 5.2 mmol/L 4.3  4.5  4.7   Chloride 96 - 106 mmol/L 102  105  104   CO2 20 - 29 mmol/L 24  26  30    Calcium 8.6 - 10.2 mg/dL 9.6  9.6  9.6   Total Protein 6.0 - 8.3 g/dL  6.8  6.9   Total Bilirubin 0.2 - 1.2 mg/dL  0.8  1.0   Alkaline Phos 39 - 117 U/L  36  44   AST 0 - 37 U/L  15  14   ALT 0 - 53 U/L  12  11    ASSESSMENT & PLAN  Orders Placed This Encounter  Procedures   US SPLEEN (ABDOMEN LIMITED)    Standing Status:   Future    Standing Expiration Date:   11/16/2023    Order Specific Question:   Reason for Exam (SYMPTOM  OR DIAGNOSIS REQUIRED)    Answer:   plapable spleen    Order Specific Question:   Preferred imaging location?    Answer:   [facility]   CBC with Differential (Cancer Center Only)     Standing Status:   Future    Number of Occurrences:   1    Standing Expiration Date:   11/16/2023   CMP (Cancer Center only)    Standing Status:   Future    Number of Occurrences:   1    Standing Expiration Date:   11/16/2023   Reticulocytes    Standing Status:   Future    Number of Occurrences:   1    Standing Expiration Date:   11/16/2023   Erythropoietin    Standing Status:   Future    Number of Occurrences:   1    Standing Expiration Date:   11/16/2023   Ferritin    Standing Status:   Future    Number of Occurrences:   1    Standing Expiration Date:   11/16/2023   BCR ABL1 FISH ([facility])    Standing Status:   Future    Number of Occurrences:   1    Standing Expiration Date:   11/16/2023   JAK2 (INCLUDING V617F AND EXON 12), MPL,& CALR W/RFL MPN PANEL (NGS)    Standing Status:   Future    Number of Occurrences:   1    Standing Expiration Date:   11/16/2023   There are two types of polycythemia, Primary polycythemia and secondary polycythemia. Primary polycythemia is overproduction of red blood cells due to a  driver mutation. The most common mutation is the JAK2 V617F (95% of cases), but there are other mutations which can cause this disorder. Primary polycythemia is a myeloproliferative neoplasm which may require cytoreductive therapy to decrease risk of thrombosis. This can consist of medications or phlebotomy to drive down the red blood cell counts. Secondary polycythemia is polycythemia driven by low oxygen levels. This represents an appropriate response of the body attempting to increase red cell volume. Causes of secondary polycythemia include smoking (most common), obstructive sleep apnea (OSA), or living at altitude. This can also be caused by testosterone supplementation. Certain thalassemias can present with marked erythrocytosis , but normal hemoglobin. Secondary polycythemia does not have the same level of thrombotic risk and therefore does not require cytoreductive therapy or  phlebotomy.   #Polycythemia, possible combination in nature vs secondary due to altitude  --workup to include CBC, CMP, reticulocyte count, and erythropoietin level   --patient is a non smoker and does not use any testosterone containing products --no signs of symptoms concerning for OSA --will order MPN workup to include JAK2 with reflex and BCR/ABL FISH --RTC in 1 months with APP/labs (CBC, CMP, ferritin, iron). If ferritin and iron are significantly elevated I would suggest he donate blood between now and our next visit.  -- US spleen to evaluate for his palpable spleen.   All questions were answered. The patient knows to call the clinic with any problems, questions or concerns.  I have spent a total of 30 minutes minutes of face-to-face and non-face-to-face time, preparing to see the patient, obtaining and/or reviewing separately obtained history, performing a medically appropriate examination, counseling and educating the patient, ordering medications/tests/procedures, referring and communicating with other health care professionals, documenting clinical information in the electronic health record, independently interpreting results and communicating results to the patient, and care coordination.    Clent Jacks PA-C Department of Hematology/Oncology [facility] at [facility]

## 2022-11-17 LAB — IRON AND IRON BINDING CAPACITY (CC-WL,HP ONLY)
Iron: 84 ug/dL (ref 45–182)
Saturation Ratios: 27 % (ref 17.9–39.5)
TIBC: 315 ug/dL (ref 250–450)
UIBC: 231 ug/dL (ref 117–376)

## 2022-11-18 LAB — ERYTHROPOIETIN: Erythropoietin: 8.8 m[IU]/mL (ref 2.6–18.5)

## 2022-11-26 LAB — JAK2 (INCLUDING V617F AND EXON 12), MPL,& CALR W/RFL MPN PANEL (NGS)

## 2022-11-26 LAB — BCR ABL1 FISH (GENPATH)

## 2022-12-07 ENCOUNTER — Ambulatory Visit (HOSPITAL_COMMUNITY): Payer: Medicare Other | Attending: Internal Medicine

## 2022-12-07 DIAGNOSIS — I4891 Unspecified atrial fibrillation: Secondary | ICD-10-CM | POA: Diagnosis not present

## 2022-12-07 DIAGNOSIS — Z7901 Long term (current) use of anticoagulants: Secondary | ICD-10-CM

## 2022-12-07 DIAGNOSIS — I482 Chronic atrial fibrillation, unspecified: Secondary | ICD-10-CM | POA: Diagnosis present

## 2022-12-07 DIAGNOSIS — I429 Cardiomyopathy, unspecified: Secondary | ICD-10-CM

## 2022-12-07 LAB — ECHOCARDIOGRAM COMPLETE
AR max vel: 3.23 cm2
AV Area VTI: 3.04 cm2
AV Area mean vel: 3.06 cm2
AV Mean grad: 2.6 mmHg
AV Peak grad: 4.7 mmHg
Ao pk vel: 1.08 m/s
Area-P 1/2: 3.95 cm2
P 1/2 time: 640 msec
S' Lateral: 4 cm

## 2022-12-15 ENCOUNTER — Other Ambulatory Visit: Payer: Self-pay | Admitting: Medical

## 2022-12-16 ENCOUNTER — Inpatient Hospital Stay: Payer: Medicare Other

## 2022-12-16 ENCOUNTER — Inpatient Hospital Stay: Payer: Medicare Other | Admitting: Medical Oncology

## 2022-12-21 ENCOUNTER — Other Ambulatory Visit: Payer: Self-pay

## 2022-12-21 MED ORDER — EMPAGLIFLOZIN 10 MG PO TABS: 10.0000 mg | ORAL_TABLET | Freq: Every day | ORAL | 3 refills | Status: DC

## 2022-12-21 NOTE — Telephone Encounter (Signed)
Pt's medication was sent to pt's pharmacy as requested. Confirmation received.  °

## 2023-01-04 ENCOUNTER — Ambulatory Visit: Payer: Medicare Other | Admitting: Physician Assistant

## 2023-01-10 ENCOUNTER — Telehealth: Payer: Self-pay | Admitting: Medical Oncology

## 2023-01-10 NOTE — Telephone Encounter (Signed)
spoke with patient he wanted to reschedule scan of spleen, call transferred to centralized scheduling to schedule. made patient aware of missed appointment with sarah covington on 12/16/2022, did not want to schedule a follow up with her at this time.

## 2023-01-14 ENCOUNTER — Ambulatory Visit (HOSPITAL_BASED_OUTPATIENT_CLINIC_OR_DEPARTMENT_OTHER)
Admission: RE | Admit: 2023-01-14 | Discharge: 2023-01-14 | Disposition: A | Discharge status: Home / Self Care | Payer: Medicare Other | Source: Ambulatory Visit | Attending: Medical Oncology | Admitting: Medical Oncology

## 2023-01-14 ENCOUNTER — Other Ambulatory Visit: Payer: Self-pay | Admitting: Medical Oncology

## 2023-01-14 DIAGNOSIS — R161 Splenomegaly, not elsewhere classified: Secondary | ICD-10-CM | POA: Diagnosis present

## 2023-01-14 DIAGNOSIS — D751 Secondary polycythemia: Secondary | ICD-10-CM | POA: Diagnosis present

## 2023-01-14 DIAGNOSIS — D7389 Other diseases of spleen: Secondary | ICD-10-CM

## 2023-02-10 ENCOUNTER — Other Ambulatory Visit: Payer: Self-pay | Admitting: Cardiovascular Disease

## 2023-03-29 ENCOUNTER — Telehealth: Payer: Self-pay | Admitting: Medical

## 2023-03-29 NOTE — Telephone Encounter (Signed)
Pt states he would like to re-est with Ramon Dredge. States he moved back from [place].

## 2023-03-30 NOTE — Telephone Encounter (Signed)
Pt called back to f/u. He stated he doesn't understand why he was removed as a pt. Please advise.

## 2023-03-31 NOTE — Telephone Encounter (Signed)
Unaware why pt was removed probably because of his move to [facility]  but he is able to see Ramon Dredge for an appt , please get him scheduled

## 2023-04-07 ENCOUNTER — Ambulatory Visit: Payer: Medicare Other | Admitting: Medical

## 2023-04-07 ENCOUNTER — Telehealth: Payer: Self-pay | Admitting: Medical

## 2023-04-07 ENCOUNTER — Encounter: Payer: Self-pay | Admitting: Medical

## 2023-04-07 VITALS — BP 120/62 | HR 77 | Resp 18 | Ht 72.0 in | Wt 244.2 lb

## 2023-04-07 DIAGNOSIS — Z7984 Long term (current) use of oral hypoglycemic drugs: Secondary | ICD-10-CM

## 2023-04-07 DIAGNOSIS — R350 Frequency of micturition: Secondary | ICD-10-CM

## 2023-04-07 DIAGNOSIS — E119 Type 2 diabetes mellitus without complications: Secondary | ICD-10-CM

## 2023-04-07 LAB — COMPREHENSIVE METABOLIC PANEL
ALT: 10 U/L (ref 0–53)
AST: 13 U/L (ref 0–37)
Albumin: 4.2 g/dL (ref 3.5–5.2)
Alkaline Phosphatase: 36 U/L — ABNORMAL LOW (ref 39–117)
BUN: 19 mg/dL (ref 6–23)
CO2: 30 meq/L (ref 19–32)
Calcium: 9.2 mg/dL (ref 8.4–10.5)
Chloride: 103 meq/L (ref 96–112)
Creatinine, Ser: 1.18 mg/dL (ref 0.40–1.50)
GFR: 63.05 mL/min (ref >60.00)
Glucose, Bld: 95 mg/dL (ref 70–99)
Potassium: 4.8 meq/L (ref 3.5–5.1)
Sodium: 138 meq/L (ref 135–145)
Total Bilirubin: 1.1 mg/dL (ref 0.2–1.2)
Total Protein: 6.5 g/dL (ref 6.0–8.3)

## 2023-04-07 LAB — POC URINALSYSI DIPSTICK (AUTOMATED)
Bilirubin, UA: NEGATIVE
Blood, UA: NEGATIVE
Glucose, UA: POSITIVE — AB
Ketones, UA: NEGATIVE
Leukocytes, UA: NEGATIVE
Nitrite, UA: NEGATIVE
Protein, UA: NEGATIVE
Spec Grav, UA: 1.01 (ref 1.010–1.025)
Urobilinogen, UA: 0.2 U/dL
pH, UA: 5 (ref 5.0–8.0)

## 2023-04-07 LAB — PSA: PSA: 2.78 ng/mL (ref 0.10–4.00)

## 2023-04-07 LAB — HEMOGLOBIN A1C: Hgb A1c MFr Bld: 6.1 % (ref 4.6–6.5)

## 2023-04-07 NOTE — Patient Instructions (Addendum)
1. Frequent urination Recent increased frequency of urination.  History of Jardiance use for 3 years.  Although Jardiance can increase frequency to some degree since you been on medication for prolonged I am not convinced that acute increase related to the medication.  High sugars and of itself could increase frequency.  Also prostate may be etiology/cause or could urinary infection related.  Will follow below labs and might refer you to urology to fully assess size of the bladder and if you have postvoid residual volume.  -I will keep you updated on these lab as a come in.  - PSA - POCT Urinalysis Dipstick (Automated) - Urine Culture  2. Diabetes mellitus without complication (HCC) On Jardiance and metformin. - Hemoglobin A1c - Comp Met (CMET)   Follow-up date to be determined after lab review.

## 2023-04-07 NOTE — Progress Notes (Signed)
Subjective:    Patient ID: Jonathon Price, male    DOB: 1955/10/08, 67 y.o.   MRN: [id number]  HPI  Pt in with some frequent urination. He states every 2 hours. He state will urinate. He states this has been going on for about 2 months. In the past for years would urinate every 5-6 hours but more dependant on intake of fluids.  Pt thinks related to his meds/diabetic types.  Pt has been on jardiance for 3 years.   No fever, no chills or sweats. No perineum pain.  3 month ago had psa that was normal.  Pt lives singificant majority of the year in [place].    Review of Systems  Constitutional:  Negative for chills, fatigue and fever.  Respiratory:  Negative for cough, chest tightness, shortness of breath and wheezing.   Cardiovascular:  Negative for chest pain and palpitations.  Gastrointestinal:  Negative for abdominal pain.  Genitourinary:  Positive for frequency. Negative for decreased urine volume, dysuria and penile pain.       Pt states can start urine flow easily but low volume. After he uses restroom he feels like it empties completely.  Occasional severe urgency but typically not.  Musculoskeletal:  Negative for back pain, joint swelling and myalgias.    Past Medical History:  Diagnosis Date   A-fib (HCC)    Hypertension      Social History   Socioeconomic History   Marital status: Married    Spouse name: Not on file   Number of children: 2   Years of education: Not on file   Highest education level: Not on file  Occupational History   Occupation: Emergency planning/management officer   Tobacco Use   Smoking status: Never   Smokeless tobacco: Never  Vaping Use   Vaping status: Not on file  Substance and Sexual Activity   Alcohol use: No    Alcohol/week: 0.0 standard drinks of alcohol   Drug use: No   Sexual activity: Yes  Other Topics Concern   Not on file  Social History Narrative   Home is [place], works in [employer] most of the time , travels to different states in an RV    Social Determinants of Health   Financial Resource Strain: Patient Declined (04/01/2023)   Overall Financial Resource Strain (CARDIA)    Difficulty of Paying Living Expenses: Patient declined  Food Insecurity: Patient Declined (04/01/2023)   Hunger Vital Sign    Worried About Running Out of Food in the Last Year: Patient declined    Ran Out of Food in the Last Year: Patient declined  Transportation Needs: Patient Declined (04/01/2023)   PRAPARE - Administrator, Civil Service (Medical): Patient declined    Lack of Transportation (Non-Medical): Patient declined  Physical Activity: Unknown (04/01/2023)   Exercise Vital Sign    Days of Exercise per Week: Patient declined    Minutes of Exercise per Session: Not on file  Stress: Patient Declined (04/01/2023)   [employer] of Occupational Health - Occupational Stress Questionnaire    Feeling of Stress : Patient declined  Social Connections: Unknown (04/01/2023)   Social Connection and Isolation Panel [NHANES]    Frequency of Communication with Friends and Family: Patient declined    Frequency of Social Gatherings with Friends and Family: Patient declined    Attends Religious Services: Patient declined    Active Member of Clubs or Organizations: Patient declined    Attends Banker Meetings: Not on file  Marital Status: Patient declined  Intimate Partner Violence: Not At Risk (02/17/2022)   [facility]    Threatened: Not on file    Insulted: Not on file    Physically Hurt : Not on file    Scream: Not on file    Past Surgical History:  Procedure Laterality Date   right ankle operation  12 31 1978    Family History  Problem Relation Age of Onset   Breast cancer Mother    CAD Neg Hx    Atrial fibrillation Neg Hx    Colon cancer Neg Hx    Prostatitis Neg Hx    Diabetes Neg Hx     No Known Allergies  Current Outpatient Medications on File Prior to Visit   Medication Sig Dispense Refill   empagliflozin (JARDIANCE) 10 MG TABS tablet Take 1 tablet (10 mg total) by mouth daily. 90 tablet 3   metFORMIN (GLUCOPHAGE) 500 MG tablet TAKE 1 TABLET BY MOUTH TWICE A DAY WITH FOOD 180 tablet 0   sacubitril-valsartan (ENTRESTO) 49-51 MG TAKE 1 TABLET BY MOUTH TWICE A DAY 60 tablet 9   XARELTO 20 MG TABS tablet TAKE 1 TABLET BY MOUTH EVERY DAY 30 tablet 5   atorvastatin (LIPITOR) 10 MG tablet Take 1 tablet (10 mg total) by mouth daily. 30 tablet 3   metoprolol succinate (TOPROL-XL) 50 MG 24 hr tablet Take 1 tablet (50 mg total) by mouth 2 (two) times daily. 90 tablet 0   No current facility-administered medications on file prior to visit.    BP 120/62 (BP Location: Left Arm, Patient Position: Sitting, Cuff Size: Large)   Pulse 77   Resp 18   Ht 6' (1.829 m)   Wt 244 lb 3.2 oz (110.8 kg)   SpO2 98%   BMI 33.12 kg/m        Objective:   Physical Exam   General Mental Status- Alert. General Appearance- Not in acute distress.   Skin General: Color- Normal Color. Moisture- Normal Moisture.  Neck Carotid Arteries- Normal color. Moisture- Normal Moisture. No carotid bruits. No JVD.  Chest and Lung Exam Auscultation: Breath Sounds:-Normal.  Cardiovascular Auscultation:Rythm- Regular. Murmurs & Other Heart Sounds:Auscultation of the heart reveals- No Murmurs.  Abdomen Inspection:-Inspeection Normal. Palpation/Percussion:Note:No mass. Palpation and Percussion of the abdomen reveal- Non Tender, Non Distended + BS, no rebound or guarding.  No suprapubic tenderness.  Back-no CVA pain.   Neurologic Cranial Nerve exam:- CN III-XII intact(No nystagmus), symmetric smile. Strength:- 5/5 equal and symmetric strength both upper and lower extremities.        Assessment & Plan:  1. Frequent urination Recent increased frequency of urination.  History of Jardiance use for 3 years.  Although Jardiance can increase frequency to some degree since  you been on medication for prolonged I am not convinced that acute increase related to the medication.  High sugars and of itself could increase frequency.  Also prostate may be etiology/cause or could urinary infection related.  Will follow below labs and might refer you to urology to fully assess size of the bladder and if you have postvoid residual volume.  -I will keep you updated on these lab as a come in.  - PSA - POCT Urinalysis Dipstick (Automated) - Urine Culture  2. Diabetes mellitus without complication (HCC) On Jardiance and metformin. - Hemoglobin A1c - Comp Met (CMET)   Follow-up date to be determined after lab review.  [name], PA-C

## 2023-04-07 NOTE — Telephone Encounter (Signed)
I have this question for sarah covington pa-C with hematologist. I can't find her in epic to send her this message. I also don't know which doctor she works with. On review of the note I don't this it is Dr. Myna Hidalgo. Can you get this message to correct hematologist office   Pt states that hematology order imaging studies that were to be done in [place]. He states he does not want to go to [place]. I could not see imaging study ordered. He described imaging to evauate abd/pelvis? Just wanted to follow up on this as he had mentioned. Also does he have need follow up with hematology?

## 2023-04-08 ENCOUNTER — Other Ambulatory Visit: Payer: Self-pay

## 2023-04-08 ENCOUNTER — Other Ambulatory Visit (INDEPENDENT_AMBULATORY_CARE_PROVIDER_SITE_OTHER): Payer: Medicare Other

## 2023-04-08 DIAGNOSIS — I10 Essential (primary) hypertension: Secondary | ICD-10-CM

## 2023-04-08 LAB — CBC WITH DIFFERENTIAL/PLATELET
Basophils Absolute: 0.1 10*3/uL (ref 0.0–0.1)
Basophils Relative: 1.4 % (ref 0.0–3.0)
Eosinophils Absolute: 0.4 10*3/uL (ref 0.0–0.7)
Eosinophils Relative: 5.4 % — ABNORMAL HIGH (ref 0.0–5.0)
HCT: 54.2 % — ABNORMAL HIGH (ref 39.0–52.0)
Hemoglobin: 17.6 g/dL — ABNORMAL HIGH (ref 13.0–17.0)
Lymphocytes Relative: 27.2 % (ref 12.0–46.0)
Lymphs Abs: 1.8 10*3/uL (ref 0.7–4.0)
MCHC: 32.4 g/dL (ref 30.0–36.0)
MCV: 95 fL (ref 78.0–100.0)
Monocytes Absolute: 0.4 10*3/uL (ref 0.1–1.0)
Monocytes Relative: 6.3 % (ref 3.0–12.0)
Neutro Abs: 3.9 10*3/uL (ref 1.4–7.7)
Neutrophils Relative %: 59.7 % (ref 43.0–77.0)
Platelets: 195 10*3/uL (ref 150.0–400.0)
RBC: 5.71 Mil/uL (ref 4.22–5.81)
RDW: 15 % (ref 11.5–15.5)
WBC: 6.6 10*3/uL (ref 4.0–10.5)

## 2023-04-08 LAB — URINE CULTURE
MICRO NUMBER:: 15547896
SPECIMEN QUALITY:: ADEQUATE

## 2023-04-08 NOTE — Telephone Encounter (Signed)
Pt notified via mychart

## 2023-04-09 NOTE — Addendum Note (Signed)
Addended by: Gwenevere Abbot on: 04/09/2023 04:54 PM   Modules accepted: Orders

## 2023-04-10 MED ORDER — CEPHALEXIN 500 MG PO CAPS: 500.0000 mg | ORAL_CAPSULE | Freq: Two times a day (BID) | ORAL | 0 refills | Status: DC

## 2023-04-10 NOTE — Addendum Note (Signed)
Addended by: Gwenevere Abbot on: 04/10/2023 06:55 AM   Modules accepted: Orders

## 2023-06-15 ENCOUNTER — Telehealth: Payer: Self-pay | Admitting: Medical

## 2023-06-15 NOTE — Telephone Encounter (Signed)
Pt called to advise that he has appt with urologist on 07/13/23 but wants to know if there is a medication he can take until then. Please call to advise

## 2023-06-16 NOTE — Telephone Encounter (Signed)
Pt stated he is out town until [place] , and just wanted to know if there is something he should do or take until his appt in January

## 2023-06-16 NOTE — Telephone Encounter (Signed)
Pt called , busy dial tone

## 2023-06-21 NOTE — Telephone Encounter (Signed)
Pt called and lvm to return call 

## 2023-07-09 ENCOUNTER — Other Ambulatory Visit: Payer: Self-pay | Admitting: Medical

## 2023-07-12 ENCOUNTER — Encounter: Payer: Self-pay | Admitting: Medical

## 2023-07-13 ENCOUNTER — Ambulatory Visit: Payer: Medicare Other | Admitting: Urology

## 2023-07-14 ENCOUNTER — Telehealth: Payer: Self-pay

## 2023-07-14 MED ORDER — METOPROLOL SUCCINATE ER 50 MG PO TB24: 50.0000 mg | ORAL_TABLET | Freq: Two times a day (BID) | ORAL | 0 refills | Status: DC

## 2023-07-14 NOTE — Telephone Encounter (Signed)
 Medication sent 07/11/23 , resent today

## 2023-07-14 NOTE — Telephone Encounter (Signed)
 Copied from CRM [id number]. Topic: Clinical - Prescription Issue >> Jul 13, 2023  4:10 PM Leila BROCKS wrote: Reason for CRM: Patient [id number] [facility]  succinate (TOPROL -XL) 50 MG 24 hr tablet and keeps contacting patient to contact the office to get authorization. Patient asking the office to resend the medication again, patient has only two pills left.   [id number] [place], [place] - [address] [place] [address] [phone]:[phone]

## 2023-08-20 ENCOUNTER — Other Ambulatory Visit: Payer: Self-pay | Admitting: Medical

## 2023-09-29 ENCOUNTER — Other Ambulatory Visit: Payer: Self-pay | Admitting: Cardiovascular Disease

## 2023-09-29 DIAGNOSIS — I482 Chronic atrial fibrillation, unspecified: Secondary | ICD-10-CM

## 2023-09-29 NOTE — Telephone Encounter (Signed)
 Prescription refill request for Xarelto received.   Indication: afib  Last office visit: Nishan 11/08/2022 Weight: 110.8 kg  Age: 68 yo  Scr: 1.18 (04/07/2023) CrCl: 93 ml/min   Refill sent.

## 2023-10-13 ENCOUNTER — Telehealth: Payer: Self-pay | Admitting: Cardiovascular Disease

## 2023-10-13 DIAGNOSIS — I429 Cardiomyopathy, unspecified: Secondary | ICD-10-CM

## 2023-10-13 NOTE — Telephone Encounter (Signed)
 Patient wants orders to have an echocardiogram done prior to his visit on 7/23.

## 2023-10-13 NOTE — Telephone Encounter (Signed)
 Called patient to let him know Dr. Eden Emms is fine to order an echo for him to have before his office visit.

## 2023-10-18 ENCOUNTER — Encounter: Payer: Self-pay | Admitting: Medical

## 2023-10-18 ENCOUNTER — Ambulatory Visit (INDEPENDENT_AMBULATORY_CARE_PROVIDER_SITE_OTHER): Admitting: Medical

## 2023-10-18 VITALS — BP 130/78 | HR 84 | Temp 98.2°F | Resp 18 | Ht 72.0 in | Wt 246.0 lb

## 2023-10-18 DIAGNOSIS — R350 Frequency of micturition: Secondary | ICD-10-CM | POA: Diagnosis not present

## 2023-10-18 DIAGNOSIS — I4891 Unspecified atrial fibrillation: Secondary | ICD-10-CM | POA: Diagnosis not present

## 2023-10-18 DIAGNOSIS — D75839 Thrombocytosis, unspecified: Secondary | ICD-10-CM

## 2023-10-18 DIAGNOSIS — E119 Type 2 diabetes mellitus without complications: Secondary | ICD-10-CM | POA: Diagnosis not present

## 2023-10-18 DIAGNOSIS — I10 Essential (primary) hypertension: Secondary | ICD-10-CM

## 2023-10-18 DIAGNOSIS — Z7984 Long term (current) use of oral hypoglycemic drugs: Secondary | ICD-10-CM

## 2023-10-18 DIAGNOSIS — E785 Hyperlipidemia, unspecified: Secondary | ICD-10-CM

## 2023-10-18 LAB — COMPREHENSIVE METABOLIC PANEL WITH GFR
ALT: 12 U/L (ref 0–53)
AST: 15 U/L (ref 0–37)
Albumin: 4.6 g/dL (ref 3.5–5.2)
Alkaline Phosphatase: 43 U/L (ref 39–117)
BUN: 17 mg/dL (ref 6–23)
CO2: 29 meq/L (ref 19–32)
Calcium: 9.8 mg/dL (ref 8.4–10.5)
Chloride: 103 meq/L (ref 96–112)
Creatinine, Ser: 1.16 mg/dL (ref 0.40–1.50)
GFR: 64.11 mL/min (ref >60.00)
Glucose, Bld: 104 mg/dL — ABNORMAL HIGH (ref 70–99)
Potassium: 4.7 meq/L (ref 3.5–5.1)
Sodium: 139 meq/L (ref 135–145)
Total Bilirubin: 1.1 mg/dL (ref 0.2–1.2)
Total Protein: 7 g/dL (ref 6.0–8.3)

## 2023-10-18 LAB — LIPID PANEL
Cholesterol: 208 mg/dL — ABNORMAL HIGH (ref 0–200)
HDL: 53.9 mg/dL (ref >39.00)
LDL Cholesterol: 124 mg/dL — ABNORMAL HIGH (ref 0–99)
NonHDL: 154.5
Total CHOL/HDL Ratio: 4
Triglycerides: 151 mg/dL — ABNORMAL HIGH (ref 0.0–149.0)
VLDL: 30.2 mg/dL (ref 0.0–40.0)

## 2023-10-18 LAB — PSA: PSA: 3.67 ng/mL (ref 0.10–4.00)

## 2023-10-18 LAB — MICROALBUMIN / CREATININE URINE RATIO
Creatinine,U: 88.5 mg/dL
Microalb Creat Ratio: 41.7 mg/g — ABNORMAL HIGH (ref 0.0–30.0)
Microalb, Ur: 3.7 mg/dL — ABNORMAL HIGH (ref 0.0–1.9)

## 2023-10-18 LAB — HEMOGLOBIN A1C: Hgb A1c MFr Bld: 6.1 % (ref 4.6–6.5)

## 2023-10-18 NOTE — Patient Instructions (Signed)
 Atrial Fibrillation On metoprolol for rate control and Xarelto for anticoagulation. Blood pressure and heart rate control are being monitored. - Continue metoprolol and Xarelto as prescribed. - Monitor blood pressure and heart rate.  Cardiomyopathy On Entresto for management. - Continue Entresto as prescribed.  Diabetes Mellitus Type 2 Last A1c was 6.1. On Jardiance and metformin. A1c is due in November. Discussed importance of monitoring diabetic changes in eye exams and urine microalbumin levels. - Check A1c in September as scheduled. - Continue Jardiance and metformin as prescribed. - Perform urine microalbumin study today.  Hyperlipidemia Advised by cardiologist to increase exercise and improve diet. Acknowledges weight gain due to increased work hours and poor dietary habits. - Encourage increased physical activity and dietary modifications.  Benign Prostatic Hyperplasia (BPH) Reports urinary frequency, possibly related to medication. PSA levels well-managed. Discussed PSA monitoring for significant changes. - Check PSA level to monitor for significant changes.  Splenic Calcifications Scattered calcifications in the spleen. Declines further follow-up at this time.  General Health Maintenance Up to date on colonoscopy. Discussed Cologuard as an option. Pneumonia vaccine declined. Shingrix vaccine discussed. - Review last colonoscopy report to determine if Cologuard is an option. - Offer Shingrix vaccine through pharmacy if interested.  Follow-up Follow-up appointment with cardiologist in May. Discussed need for regular monitoring of blood parameters and metabolic health. - Perform CBC, metabolic panel, and lipid panel today. - Follow up with cardiologist in May.

## 2023-10-18 NOTE — Progress Notes (Unsigned)
 Subjective:    Patient ID: Jonathon Price, male    DOB: 09/15/1955, 68 y.o.   MRN: [id number]  HPI Jonathon Price is a 68 year old male with hyperlipidemia and atrial fibrillation who presents for follow-up on cholesterol management and urinary symptoms.  He has hyperlipidemia and has experienced weight gain over the past nine to ten months, which he attributes to poor eating habits due to his work schedule of 45-50 hours a week. He is aware that his current diet is not ideal.  He experiences urinary frequency, which he believes is related to his medication. The urgency has improved, and there are no accidents or emergencies. His PSA levels have remained stable between 2.73 and 2.78 over the past twelve to fifteen years.  He has a history of atrial fibrillation and is currently on metoprolol 50 mg, Xarelto 20 mg, and Entresto. Blood pressure readings are often high on initial checks. He has not been exposed to high altitudes recently and has not undergone a sleep study. He does not believe he has sleep apnea as he does not snore.  He manages type 2 diabetes mellitus with Jardiance and metformin, with his last A1c being 6.1. An eye exam is scheduled in September to check for diabetic changes. He has not received a pneumonia vaccine and declined it during this visit.     Review of Systems  Constitutional:  Negative for chills, fatigue and fever.  HENT:  Negative for congestion.   Respiratory:  Negative for cough, chest tightness, shortness of breath and wheezing.   Cardiovascular:  Negative for chest pain and palpitations.  Gastrointestinal:  Negative for abdominal pain.  Genitourinary:  Positive for frequency.       Mild   Musculoskeletal:  Negative for back pain and neck pain.  Skin:  Negative for rash.  Neurological:  Negative for dizziness, weakness and headaches.  Hematological:  Negative for adenopathy.  Psychiatric/Behavioral:  Negative for behavioral problems and  dysphoric mood. The patient is not nervous/anxious.            Objective:   Physical Exam   General Mental Status- Alert. General Appearance- Not in acute distress.   Skin General: Color- Normal Color. Moisture- Normal Moisture.  Neck Carotid Arteries- Normal color. Moisture- Normal Moisture. No carotid bruits. No JVD.  Chest and Lung Exam Auscultation: Breath Sounds:-Normal.  Cardiovascular Auscultation:Rythm- Regular. Murmurs & Other Heart Sounds:Auscultation of the heart reveals- No Murmurs.  Abdomen Inspection:-Inspeection Normal. Palpation/Percussion:Note:No mass. Palpation and Percussion of the abdomen reveal- Non Tender, Non Distended + BS, no rebound or guarding.   Neurologic Cranial Nerve exam:- CN III-XII intact(No nystagmus), symmetric smile. Strength:- 5/5 equal and symmetric strength both upper and lower extremities.    Lower ext- calfs normal. No changes from baseline per pt. Negative homan signs. No pedal edema.      Assessment & Plan:   Patient Instructions  Atrial Fibrillation On metoprolol for rate control and Xarelto for anticoagulation. Blood pressure and heart rate control are being monitored. - Continue metoprolol and Xarelto as prescribed. - Monitor blood pressure and heart rate.  Cardiomyopathy On Entresto for management. - Continue Entresto as prescribed.  Diabetes Mellitus Type 2 Last A1c was 6.1. On Jardiance and metformin. A1c is due in November. Discussed importance of monitoring diabetic changes in eye exams and urine microalbumin levels. - Check A1c in September as scheduled. - Continue Jardiance and metformin as prescribed. - Perform urine microalbumin study today.  Hyperlipidemia Advised by cardiologist  to increase exercise and improve diet. Acknowledges weight gain due to increased work hours and poor dietary habits. - Encourage increased physical activity and dietary modifications.  Benign Prostatic Hyperplasia  (BPH) Reports urinary frequency, possibly related to medication. PSA levels well-managed. Discussed PSA monitoring for significant changes. - Check PSA level to monitor for significant changes.  Splenic Calcifications Scattered calcifications in the spleen. Declines further follow-up at this time.  General Health Maintenance Up to date on colonoscopy. Discussed Cologuard as an option. Pneumonia vaccine declined. Shingrix vaccine discussed. - Review last colonoscopy report to determine if Cologuard is an option. - Offer Shingrix vaccine through pharmacy if interested.  Follow-up Follow-up appointment with cardiologist in May. Discussed need for regular monitoring of blood parameters and metabolic health. - Perform CBC, metabolic panel, and lipid panel today. - Follow up with cardiologist in May.

## 2023-10-19 ENCOUNTER — Encounter: Payer: Self-pay | Admitting: Medical

## 2023-11-17 ENCOUNTER — Ambulatory Visit (HOSPITAL_COMMUNITY)
Admission: RE | Admit: 2023-11-17 | Discharge: 2023-11-17 | Disposition: A | Discharge status: Home / Self Care | Source: Ambulatory Visit | Attending: Cardiovascular Disease | Admitting: Cardiovascular Disease

## 2023-11-17 ENCOUNTER — Ambulatory Visit: Payer: Self-pay | Admitting: Cardiovascular Disease

## 2023-11-17 DIAGNOSIS — I429 Cardiomyopathy, unspecified: Secondary | ICD-10-CM | POA: Insufficient documentation

## 2023-11-17 DIAGNOSIS — I351 Nonrheumatic aortic (valve) insufficiency: Secondary | ICD-10-CM | POA: Diagnosis not present

## 2023-11-17 DIAGNOSIS — I10 Essential (primary) hypertension: Secondary | ICD-10-CM | POA: Insufficient documentation

## 2023-11-17 DIAGNOSIS — I4891 Unspecified atrial fibrillation: Secondary | ICD-10-CM | POA: Diagnosis not present

## 2023-11-17 DIAGNOSIS — I482 Chronic atrial fibrillation, unspecified: Secondary | ICD-10-CM

## 2023-11-17 LAB — ECHOCARDIOGRAM COMPLETE
P 1/2 time: 520 ms
S' Lateral: 4.6 cm

## 2023-11-30 ENCOUNTER — Encounter: Payer: Self-pay | Admitting: Medical

## 2023-11-30 NOTE — Telephone Encounter (Signed)
-----   Message from Janelle Mediate sent at 11/17/2023  9:36 PM EDT ----- EF mildly reduced stable f/u echo in a year

## 2023-11-30 NOTE — Telephone Encounter (Signed)
Placed order for echo to be done in one year.

## 2023-12-05 ENCOUNTER — Other Ambulatory Visit: Payer: Self-pay

## 2023-12-05 MED ORDER — ENTRESTO 49-51 MG PO TABS: 1.0000 | ORAL_TABLET | Freq: Two times a day (BID) | ORAL | 1 refills | Status: DC

## 2023-12-21 ENCOUNTER — Other Ambulatory Visit: Payer: Self-pay

## 2023-12-21 DIAGNOSIS — I482 Chronic atrial fibrillation, unspecified: Secondary | ICD-10-CM

## 2023-12-21 MED ORDER — RIVAROXABAN 20 MG PO TABS: 20.0000 mg | ORAL_TABLET | Freq: Every day | ORAL | 5 refills | Status: AC

## 2023-12-21 NOTE — Telephone Encounter (Signed)
 Prescription refill request for Xarelto  received.  Indication: Afib  Last office visit: 11/08/22 Jonathon Price)  Weight: 111.6kg Age: 68 Scr: 1.16 (10/18/23)  CrCl: 93.76ml/min  Appropriate dose. Refill sent.

## 2024-01-09 ENCOUNTER — Telehealth: Payer: Self-pay

## 2024-01-09 NOTE — Telephone Encounter (Signed)
 Copied from CRM [id number]. Topic: Referral - Question >> Jan 09, 2024  9:06 AM Cleave MATSU wrote: Reason for CRM: pt stated that dr saguier was supposed to refer him to a gastroenterlogist but he havent seen anything about it please follow up with pt.

## 2024-01-11 ENCOUNTER — Telehealth: Admitting: Physician Assistant

## 2024-01-11 DIAGNOSIS — R197 Diarrhea, unspecified: Secondary | ICD-10-CM

## 2024-01-11 NOTE — Progress Notes (Signed)
 Because of this being an ongoing issue since at least May 2025, I feel your condition warrants further evaluation and I recommend that you be seen for a face to face visit.  Please contact your primary care physician practice to be seen. Many offices offer virtual options to be seen via video if you are not comfortable going in person to a medical facility at this time.  There is a MyChart message from your PCP on 01/09/24 that he is wanting to get you scheduled for an appointment with him in the next week. I would recommend to call their office to schedule this appointment.  NOTE: You will NOT be charged for this eVisit.   If you are having a true medical emergency please call 911.   Your e-visit answers were reviewed by a board certified advanced clinical practitioner to complete your personal care plan.  Thank you for using e-Visits.   I have spent 5 minutes in review of e-visit questionnaire, review and updating patient chart, medical decision making and response to patient.   Delon CHRISTELLA Dickinson, PA-C

## 2024-01-13 NOTE — Telephone Encounter (Signed)
 Pt called and lvm to return call

## 2024-01-13 NOTE — Progress Notes (Signed)
 CARDIOLOGY CONSULT NOTE       Patient ID: Jonathon Price MRN: [id number] DOB/AGE: [birth year] 68 y.o.  Referring Physician: [facility]  Primary Physician: Dorina Dallas RIGGERS Primary Cardiologist: Delford Reason for Consultation: Afib/Cardiomyopathy   HPI:  68 y.o. with history of chronic afib. Now on xarelto . 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 Seen by NP 02/15/17 and note indicates being asymptomatic and patient prefers to adopt strategy of rate control and anticoagulation ? Home sleep study in [place]  and told he didn't need CPAP Intolerant to beta blockers and did not like being on cardizem  at higher dose 300 mg daily  at higher dose 300 mg daily   note indicates being asymptomatic and patient prefers to adopt strategy of rate control and anticoagulation ? 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 He like to RV. New project moving a company from [place]  to [employer] class one with no palpitations , syncope , chest pain or dyspnea   Tolerating mid dose Entresto    TTE 02/02/21 EF 35-40% Moderate bi atrial enlargement trivial MR AV sclerosis And aortic root 4.1 cm   He appears to have mostly post prandial elevation in BS Now on Jardiance  and glucophage    Sees  [facility] for his primary care needs No bleeding issues feels great  Wants full blood panel today so [facility]mon has it next week Discussed needing med for HLD given age/DM He has not wanted to be on statin Discussed utility of calcium  score to gauge risk of MI and need for Rx  TTE done 11/17/23 EF 45-50% Normal RV moderate  LAE trivial MR and mild AR     ROS All other systems reviewed and negative except as noted above  Past Medical History:  Diagnosis Date   A-fib (HCC)    Hypertension     Family History  Problem Relation Age of Onset   Breast cancer Mother    CAD Neg Hx    Atrial fibrillation Neg Hx    Colon cancer Neg Hx    Prostatitis Neg Hx    Diabetes Neg Hx     Social History   Socioeconomic History   Marital status: Married    Spouse name: Not on file   Number of children: 2   Years of education: Not on file   Highest education level: Not on file  Occupational History   Occupation: Emergency planning/management officer   Tobacco Use   Smoking status: Never   Smokeless tobacco: Never  Vaping Use   Vaping status: Not on file  Substance and Sexual Activity   Alcohol use: No    Alcohol/week: 0.0 standard drinks of alcohol   Drug use: No   Sexual activity: Yes  Other Topics Concern   Not on file  Social History Narrative   Home is [place] , works in [employer] most of the time , travels to different states in an RV   Social Drivers of Health   Financial Resource Strain: Patient Declined (10/17/2023)   Overall Financial Resource Strain (CARDIA)    Difficulty  of Paying Living Expenses: Patient declined  Food Insecurity: Patient Declined (10/17/2023)   Hunger Vital Sign    Worried About Running Out of Food in the Last Year: Patient declined    Ran Out of Food in the Last Year: Patient declined  Transportation Needs: Patient Declined (10/17/2023)   PRAPARE - Transportation    Lack of Transportation (Medical): Patient declined    Lack of Transportation (Non-Medical): Patient declined  Physical Activity: Sufficiently Active (10/17/2023)   Exercise Vital Sign    Days of Exercise per Week: 3 days    Minutes of Exercise per Session: 50 min  Stress: No Stress Concern Present (10/17/2023)   [employer] of Occupational Health - Occupational Stress Questionnaire    Feeling of Stress : Not at all  Social  Connections: Unknown (10/17/2023)   Social Connection and Isolation Panel    Frequency of Communication with Friends and Family: Patient declined    Frequency of Social Gatherings with Friends and Family: Patient declined    Attends Religious Services: Patient declined    Database administrator or Organizations: No    Attends Engineer, structural: Not on file    Marital Status: Married  Catering manager Violence: Not At Risk (02/17/2022)   Received from [facility]    Threatened: Not on file    Insulted: Not on file    Physically Hurt : Not on file    Scream: Not on file    Past Surgical History:  Procedure Laterality Date   right ankle operation  12 31 1978      Current Outpatient Medications:    empagliflozin  (JARDIANCE ) 10 MG TABS tablet, Take 1 tablet (10 mg total) by mouth daily., Disp: 90 tablet, Rfl: 3   metFORMIN  (GLUCOPHAGE ) 500 MG tablet, TAKE 1 TABLET BY MOUTH TWICE A DAY WITH FOOD, Disp: 180 tablet, Rfl: 0   metoprolol  succinate (TOPROL -XL) 50 MG 24 hr tablet, TAKE 1 TABLET BY MOUTH TWICE A DAY, Disp: 180 tablet, Rfl: 1   rivaroxaban  (XARELTO ) 20 MG TABS tablet, Take 1 tablet (20 mg total) by mouth daily., Disp: 30 tablet, Rfl: 5   sacubitril -valsartan  (ENTRESTO ) 49-51 MG, Take 1 tablet by mouth 2 (two) times daily., Disp: 60 tablet, Rfl: 1    Physical Exam: There were no vitals taken for this visit.    Affect appropriate Healthy:  appears stated age HEENT: normal Neck supple with no adenopathy JVP normal no bruits no thyromegaly Lungs clear with no wheezing and good diaphragmatic motion Heart:  S1/S2 no murmur, no rub, gallop or click PMI normal Abdomen: benighn, BS positve, no tenderness, no AAA no bruit.  No HSM or HJR Distal pulses intact with no bruits No edema Neuro non-focal Skin warm and dry No muscular weakness   Labs:   Lab Results  Component Value Date   WBC 6.6 04/08/2023   HGB 17.6 (H) 04/08/2023   HCT 54.2  Repeated and verified X2. (H) 04/08/2023   MCV 95.0 04/08/2023   PLT 195.0 04/08/2023   No results for input(s): NA, K, CL, CO2, BUN, CREATININE, CALCIUM , PROT, BILITOT, ALKPHOS, ALT, AST, GLUCOSE in the last 168 hours.  Invalid input(s): LABALBU No results found for: CKTOTAL, CKMB, CKMBINDEX, TROPONINI  Lab Results  Component Value Date   CHOL 208 (H) 10/18/2023   CHOL 224 (H) 06/14/2022   CHOL 224 (H) 11/03/2021   Lab Results  Component Value Date   HDL 53.90 10/18/2023   HDL 54.50  06/14/2022   HDL 52.20 11/03/2021   Lab Results  Component Value Date   LDLCALC 124 (H) 10/18/2023   LDLCALC 134 (H) 06/14/2022   LDLCALC 138 (H) 11/03/2021   Lab Results  Component Value Date   TRIG 151.0 (H) 10/18/2023   TRIG 174.0 (H) 06/14/2022   TRIG 169.0 (H) 11/03/2021   Lab Results  Component Value Date   CHOLHDL 4 10/18/2023   CHOLHDL 4 06/14/2022   CHOLHDL 4 11/03/2021   No results found for: LDLDIRECT    Radiology: No results found.  EKG: afib nonspecific ST changes 01/25/2024 rate 92   ASSESSMENT AND PLAN:   AFib:  rate control is fine on Xarelto   chronic CBC/BMET for blood thinner  Cardiomyopathy:  no significant CAD by cath in [place]  02/2019 Now on low dose Entresto  TTE 11/17/23 EF 45-50% Continue jardiance  Toprol  and entresto  Anticoagulation:  Currently on xarelto  CHADVASC 2 for age and HTN  DM:  On glucophage  and Jardiance  A1c 6.1 10/18/23  CAD:  risk suggested calcium  score no obstructive CAD by cath 2020  Polycythemia:  Hct 54.2 04/08/23 not on testosterone labs today HLDL  LDL 124 in setting of DM see below regarding calcium  score will need to start statin   Calcium  score Primary care labs   F/U in a year   Signed: Maude Emmer 01/25/2024, 8:10 AM

## 2024-01-25 ENCOUNTER — Other Ambulatory Visit: Payer: Self-pay | Admitting: *Deleted

## 2024-01-25 ENCOUNTER — Encounter: Payer: Self-pay | Admitting: Cardiovascular Disease

## 2024-01-25 ENCOUNTER — Other Ambulatory Visit: Payer: Self-pay

## 2024-01-25 ENCOUNTER — Ambulatory Visit: Attending: Cardiovascular Disease | Admitting: Cardiovascular Disease

## 2024-01-25 VITALS — BP 132/72 | HR 92 | Ht 72.0 in | Wt 247.0 lb

## 2024-01-25 DIAGNOSIS — Z7901 Long term (current) use of anticoagulants: Secondary | ICD-10-CM

## 2024-01-25 DIAGNOSIS — I482 Chronic atrial fibrillation, unspecified: Secondary | ICD-10-CM | POA: Diagnosis not present

## 2024-01-25 DIAGNOSIS — I340 Nonrheumatic mitral (valve) insufficiency: Secondary | ICD-10-CM

## 2024-01-25 DIAGNOSIS — I10 Essential (primary) hypertension: Secondary | ICD-10-CM

## 2024-01-25 DIAGNOSIS — E119 Type 2 diabetes mellitus without complications: Secondary | ICD-10-CM

## 2024-01-25 DIAGNOSIS — Z6835 Body mass index (BMI) 35.0-35.9, adult: Secondary | ICD-10-CM

## 2024-01-25 DIAGNOSIS — I351 Nonrheumatic aortic (valve) insufficiency: Secondary | ICD-10-CM

## 2024-01-25 DIAGNOSIS — I429 Cardiomyopathy, unspecified: Secondary | ICD-10-CM

## 2024-01-25 DIAGNOSIS — I5020 Unspecified systolic (congestive) heart failure: Secondary | ICD-10-CM

## 2024-01-25 DIAGNOSIS — E7800 Pure hypercholesterolemia, unspecified: Secondary | ICD-10-CM

## 2024-01-25 MED ORDER — METOPROLOL SUCCINATE ER 50 MG PO TB24: 50.0000 mg | ORAL_TABLET | Freq: Two times a day (BID) | ORAL | 3 refills | Status: AC

## 2024-01-25 MED ORDER — SACUBITRIL-VALSARTAN 49-51 MG PO TABS: 1.0000 | ORAL_TABLET | Freq: Two times a day (BID) | ORAL | 3 refills | Status: AC

## 2024-01-25 MED ORDER — EMPAGLIFLOZIN 10 MG PO TABS: 10.0000 mg | ORAL_TABLET | Freq: Every day | ORAL | 3 refills | Status: AC

## 2024-01-25 NOTE — Patient Instructions (Signed)
 Medication Instructions:  Your physician recommends that you continue on your current medications as directed. Please refer to the Current Medication list given to you today.  *If you need a refill on your cardiac medications before your next appointment, please call your pharmacy*  Lab Work: Your physician recommends that you have lab work today- BMET and CBC  If you have labs (blood work) drawn today and your tests are completely normal, you will receive your results only by: MyChart Message (if you have MyChart) OR A paper copy in the mail If you have any lab test that is abnormal or we need to change your treatment, we will call you to review the results.  Testing/Procedures: CT scanning for a cardiac calcium  score (CAT scanning), is a noninvasive, special x-ray that produces cross-sectional images of the body using x-rays and a computer. CT scans help physicians diagnose and treat medical conditions. For some CT exams, a contrast material is used to enhance visibility in the area of the body being studied. CT scans provide greater clarity and reveal more details than regular x-ray exams.  Follow-Up: At [facility], you and your health needs are our priority.  As part of our continuing mission to provide you with exceptional heart care, our providers are all part of one team.  This team includes your primary Cardiologist (physician) and Advanced Practice Providers or APPs (Physician Assistants and Nurse Practitioners) who all work together to provide you with the care you need, when you need it.  Your next appointment:   1 year(s)  Provider:   Maude Emmer, MD    We recommend signing up for the patient portal called MyChart.  Sign up information is provided on this After Visit Summary.  MyChart is used to connect with patients for Virtual Visits (Telemedicine).  Patients are able to view lab/test results, encounter notes, upcoming appointments, etc.  Non-urgent messages can  be sent to your provider as well.   To learn more about what you can do with MyChart, go to [email or web address].

## 2024-01-26 ENCOUNTER — Ambulatory Visit: Payer: Self-pay | Admitting: Cardiovascular Disease

## 2024-01-26 LAB — CBC WITH DIFFERENTIAL/PLATELET
Basophils Absolute: 0 x10E3/uL (ref 0.0–0.2)
Basos: 1 %
EOS (ABSOLUTE): 0.3 x10E3/uL (ref 0.0–0.4)
Eos: 4 %
Hematocrit: 55.1 % — ABNORMAL HIGH (ref 37.5–51.0)
Hemoglobin: 18.7 g/dL — ABNORMAL HIGH (ref 13.0–17.7)
Immature Grans (Abs): 0 x10E3/uL (ref 0.0–0.1)
Immature Granulocytes: 0 %
Lymphocytes Absolute: 1.8 x10E3/uL (ref 0.7–3.1)
Lymphs: 28 %
MCH: 31.3 pg (ref 26.6–33.0)
MCHC: 33.9 g/dL (ref 31.5–35.7)
MCV: 92 fL (ref 79–97)
Monocytes Absolute: 0.5 x10E3/uL (ref 0.1–0.9)
Monocytes: 8 %
Neutrophils Absolute: 3.8 x10E3/uL (ref 1.4–7.0)
Neutrophils: 58 %
Platelets: 208 x10E3/uL (ref 150–450)
RBC: 5.97 x10E6/uL — ABNORMAL HIGH (ref 4.14–5.80)
RDW: 13.9 % (ref 11.6–15.4)
WBC: 6.5 x10E3/uL (ref 3.4–10.8)

## 2024-01-26 LAB — BASIC METABOLIC PANEL WITH GFR
BUN/Creatinine Ratio: 15 (ref 10–24)
BUN: 17 mg/dL (ref 8–27)
CO2: 20 mmol/L (ref 20–29)
Calcium: 9.8 mg/dL (ref 8.6–10.2)
Chloride: 103 mmol/L (ref 96–106)
Creatinine, Ser: 1.11 mg/dL (ref 0.76–1.27)
Glucose: 94 mg/dL (ref 70–99)
Potassium: 4.7 mmol/L (ref 3.5–5.2)
Sodium: 140 mmol/L (ref 134–144)
eGFR: 71 mL/min/1.73 (ref >59)

## 2024-01-27 ENCOUNTER — Telehealth: Payer: Self-pay | Admitting: Cardiovascular Disease

## 2024-01-27 NOTE — Telephone Encounter (Signed)
  Patient went to get his labs done and once the results were released he noticed not all this labs were done. It looks like the orders were not released when they were placed. Patient would like to know what to do now.

## 2024-01-27 NOTE — Telephone Encounter (Signed)
 Patient identification verified by 2 forms.   Called and spoke to patient  Patient states:  -Pt states he was in the [facility] location down stairs this morning and the orders were not released.  -Spoke with another nurse in triage they state they released the orders this morning.  -Order req in chart.

## 2024-01-31 ENCOUNTER — Encounter: Payer: Self-pay | Admitting: Medical

## 2024-01-31 ENCOUNTER — Ambulatory Visit (INDEPENDENT_AMBULATORY_CARE_PROVIDER_SITE_OTHER): Admitting: Medical

## 2024-01-31 VITALS — BP 130/78 | HR 76 | Temp 98.0°F | Resp 18 | Ht 72.0 in | Wt 247.0 lb

## 2024-01-31 DIAGNOSIS — I10 Essential (primary) hypertension: Secondary | ICD-10-CM

## 2024-01-31 DIAGNOSIS — Z1211 Encounter for screening for malignant neoplasm of colon: Secondary | ICD-10-CM

## 2024-01-31 DIAGNOSIS — E7800 Pure hypercholesterolemia, unspecified: Secondary | ICD-10-CM

## 2024-01-31 DIAGNOSIS — R739 Hyperglycemia, unspecified: Secondary | ICD-10-CM

## 2024-01-31 DIAGNOSIS — R198 Other specified symptoms and signs involving the digestive system and abdomen: Secondary | ICD-10-CM

## 2024-01-31 DIAGNOSIS — D751 Secondary polycythemia: Secondary | ICD-10-CM | POA: Diagnosis not present

## 2024-01-31 DIAGNOSIS — E785 Hyperlipidemia, unspecified: Secondary | ICD-10-CM | POA: Diagnosis not present

## 2024-01-31 DIAGNOSIS — I4891 Unspecified atrial fibrillation: Secondary | ICD-10-CM | POA: Diagnosis not present

## 2024-01-31 DIAGNOSIS — R972 Elevated prostate specific antigen [PSA]: Secondary | ICD-10-CM | POA: Diagnosis not present

## 2024-01-31 LAB — COMPREHENSIVE METABOLIC PANEL WITH GFR
ALT: 12 U/L (ref 0–53)
AST: 14 U/L (ref 0–37)
Albumin: 4.5 g/dL (ref 3.5–5.2)
Alkaline Phosphatase: 38 U/L — ABNORMAL LOW (ref 39–117)
BUN: 16 mg/dL (ref 6–23)
CO2: 27 meq/L (ref 19–32)
Calcium: 9.5 mg/dL (ref 8.4–10.5)
Chloride: 101 meq/L (ref 96–112)
Creatinine, Ser: 1.14 mg/dL (ref 0.40–1.50)
GFR: 65.33 mL/min (ref >60.00)
Glucose, Bld: 94 mg/dL (ref 70–99)
Potassium: 4.4 meq/L (ref 3.5–5.1)
Sodium: 139 meq/L (ref 135–145)
Total Bilirubin: 1.1 mg/dL (ref 0.2–1.2)
Total Protein: 7 g/dL (ref 6.0–8.3)

## 2024-01-31 LAB — PSA: PSA: 3.08 ng/mL (ref 0.10–4.00)

## 2024-01-31 LAB — LIPID PANEL
Cholesterol: 232 mg/dL — ABNORMAL HIGH (ref 0–200)
HDL: 52.7 mg/dL (ref >39.00)
LDL Cholesterol: 145 mg/dL — ABNORMAL HIGH (ref 0–99)
NonHDL: 178.96
Total CHOL/HDL Ratio: 4
Triglycerides: 172 mg/dL — ABNORMAL HIGH (ref 0.0–149.0)
VLDL: 34.4 mg/dL (ref 0.0–40.0)

## 2024-01-31 LAB — HEMOGLOBIN A1C: Hgb A1c MFr Bld: 6.3 % (ref 4.6–6.5)

## 2024-01-31 NOTE — Progress Notes (Signed)
 Subjective:    Patient ID: Jonathon Price, male    DOB: 1955-09-04, 68 y.o.   MRN: [id number]  HPI  Jonathon Price is a 68 year old male who presents with a recent change in bowel habits.  He experienced a change in bowel habits characterized by very soft stools, occurring intermittently over a period of three weeks. This change began approximately four weeks ago and resolved last week, with his bowel movements returning to normal. No associated abdominal pain or other gastrointestinal symptoms. He has been consuming more salads and fiber-rich foods in an effort to lose weight.  He has a history of traveling frequently, particularly to western states such as [place] , [place] , and [place] , which may have influenced his dietary habits. He denies any significant changes in diet other than the increased intake of salads and fiber.  He mentions a colonoscopy performed nearly ten years ago in [place] , which revealed one polyp. He is uncertain about the follow-up recommendations as the doctor who performed the procedure has since retired. He is unsure if he has access to the report from that colonoscopy.  He has a history of polycythemia, and his recent hemoglobin was 18.7. He has previously seen a hematologist for this condition. He is currently on metoprolol  and Xarelto  for atrial fibrillation and Entresto  for cardiomyopathy. His A1c has been in the prediabetic range, and he mentions a recent increase in PSA levels, although still within normal limits.     Review of Systems  Constitutional:  Negative for chills, fatigue and fever.  Respiratory:  Negative for cough, chest tightness, shortness of breath and wheezing.   Cardiovascular:  Negative for chest pain and palpitations.  Gastrointestinal:  Negative for abdominal pain, blood in stool, constipation and rectal pain.  Genitourinary:  Negative for dysuria.  Musculoskeletal:  Negative for back pain.  Skin:  Negative for rash.   Neurological:  Negative for dizziness, syncope and headaches.  Hematological:  Negative for adenopathy.  Psychiatric/Behavioral:  Negative for behavioral problems and dysphoric mood.    Past Medical History:  Diagnosis Date   A-fib (HCC)    Hypertension      Social History   Socioeconomic History   Marital status: Married    Spouse name: Not on file   Number of children: 2   Years of education: Not on file   Highest education level: Not on file  Occupational History   Occupation: Emergency planning/management officer   Tobacco Use   Smoking status: Never   Smokeless tobacco: Never  Vaping Use   Vaping status: Not on file  Substance and Sexual Activity   Alcohol use: No    Alcohol/week: 0.0 standard drinks of alcohol   Drug use: No   Sexual activity: Yes  Other Topics Concern   Not on file  Social History Narrative   Home is [place] , works in [employer] most of the time , travels to different states in an RV   Social Drivers of Corporate investment banker Strain: Low Risk  (01/30/2024)   Overall Financial Resource Strain (CARDIA)    Difficulty of Paying Living Expenses: Not very hard  Food Insecurity: No Food Insecurity (01/30/2024)   Hunger Vital Sign    Worried About Running Out of Food in the Last Year: Never true    Ran Out of Food in the Last Year: Never true  Transportation Needs: Patient Declined (01/30/2024)   PRAPARE - Transportation    Lack of Transportation (Medical): Patient declined  Lack of Transportation (Non-Medical): Patient declined  Physical Activity: Insufficiently Active (01/30/2024)   Exercise Vital Sign    Days of Exercise per Week: 3 days    Minutes of Exercise per Session: 40 min  Stress: No Stress Concern Present (01/30/2024)   [employer] of Occupational Health - Occupational Stress Questionnaire    Feeling of Stress: Not at all  Social Connections: Moderately Isolated (01/30/2024)   Social Connection and Isolation Panel    Frequency of Communication with  Friends and Family: Twice a week    Frequency of Social Gatherings with Friends and Family: Once a week    Attends Religious Services: Patient declined    Database administrator or Organizations: No    Attends Engineer, structural: Not on file    Marital Status: Married  Catering manager Violence: Not At Risk (02/17/2022)   Received from [facility]    Threatened: Not on file    Insulted: Not on file    Physically Hurt : Not on file    Scream: Not on file    Past Surgical History:  Procedure Laterality Date   right ankle operation  12 31 1978    Family History  Problem Relation Age of Onset   Breast cancer Mother    CAD Neg Hx    Atrial fibrillation Neg Hx    Colon cancer Neg Hx    Prostatitis Neg Hx    Diabetes Neg Hx     No Known Allergies  Current Outpatient Medications on File Prior to Visit  Medication Sig Dispense Refill   empagliflozin  (JARDIANCE ) 10 MG TABS tablet Take 1 tablet (10 mg total) by mouth daily. 90 tablet 3   metFORMIN  (GLUCOPHAGE ) 500 MG tablet TAKE 1 TABLET BY MOUTH TWICE A DAY WITH FOOD 180 tablet 0   metoprolol  succinate (TOPROL -XL) 50 MG 24 hr tablet Take 1 tablet (50 mg total) by mouth 2 (two) times daily. 180 tablet 3   rivaroxaban  (XARELTO ) 20 MG TABS tablet Take 1 tablet (20 mg total) by mouth daily. 30 tablet 5   sacubitril -valsartan  (ENTRESTO ) 49-51 MG Take 1 tablet by mouth 2 (two) times daily. 180 tablet 3   No current facility-administered medications on file prior to visit.    BP 130/78   Pulse 76   Temp 98 F (36.7 C)   Resp 18   Ht 6' (1.829 m)   Wt 247 lb (112 kg)   SpO2 98%   BMI 33.50 kg/m         Objective:   Physical Exam  General Mental Status- Alert. General Appearance- Not in acute distress.   Skin General: Color- Normal Color. Moisture- Normal Moisture.  Neck Carotid Arteries- Normal color. Moisture- Normal Moisture. No carotid bruits. No JVD.  Chest and Lung  Exam Auscultation: Breath Sounds:-Normal.  Cardiovascular Auscultation:Rythm- Regular. Murmurs & Other Heart Sounds:Auscultation of the heart reveals- No Murmurs.  Abdomen Inspection:-Inspeection Normal. Palpation/Percussion:Note:No mass. Palpation and Percussion of the abdomen reveal- Non Tender, Non Distended + BS, no rebound or guarding.   Neurologic Cranial Nerve exam:- CN III-XII intact(No nystagmus), symmetric smile. Strength:- 5/5 equal and symmetric strength both upper and lower extremities.       Assessment & Plan:   Patient Instructions  Polycythemia Polycythemia with hemoglobin increased to 18.7. Previous hematology workup extensive. Established with hematologist. - Refer to hematologist with updated hemoglobin result of 18.7.  Htn -bp controlled. continue current meds.  Atrial fibrillation Atrial fibrillation managed  with metoprolol  and Xarelto .  Cardiomyopathy Cardiomyopathy managed with Entresto .  Prediabetes Previous A1c levels in prediabetic range. Current A1c not checked due to lab oversight. - Order A1c test. -continue current med and low sugar diet.  Elevated prostate-specific antigen (PSA) PSA previously normal but with increased velocity. Current PSA not checked due to lab oversight. - Order PSA test.  Normalized stool pattern.  -healthy diet and increased fiber seemed to help. -please get old colonoscopy report. If can find send me report by my chart. Then place referral for colonoscopy.  Follow up date to be determined after lab review

## 2024-01-31 NOTE — Patient Instructions (Signed)
 Polycythemia Polycythemia with hemoglobin increased to 18.7. Previous hematology workup extensive. Established with hematologist. - Refer to hematologist with updated hemoglobin result of 18.7.  Htn -bp controlled. continue current meds.  Atrial fibrillation Atrial fibrillation managed with metoprolol  and Xarelto .  Cardiomyopathy Cardiomyopathy managed with Entresto .  Prediabetes Previous A1c levels in prediabetic range. Current A1c not checked due to lab oversight. - Order A1c test. -continue current med and low sugar diet.  Elevated prostate-specific antigen (PSA) PSA previously normal but with increased velocity. Current PSA not checked due to lab oversight. - Order PSA test.  Normalized stool pattern.  -healthy diet and increased fiber seemed to help. -please get old colonoscopy report. If can find send me report by my chart. Then place referral for colonoscopy.  Follow up date to be determined after lab review

## 2024-02-01 ENCOUNTER — Encounter: Payer: Self-pay | Admitting: Medical

## 2024-02-02 NOTE — Addendum Note (Signed)
 Addended by: DORINA DALLAS HERO on: 02/02/2024 06:42 AM   Modules accepted: Orders

## 2024-02-03 ENCOUNTER — Ambulatory Visit: Payer: Self-pay | Admitting: Medical

## 2024-02-06 ENCOUNTER — Encounter: Payer: Self-pay | Admitting: Medical

## 2024-02-06 ENCOUNTER — Encounter: Payer: Self-pay | Admitting: Cardiovascular Disease

## 2024-02-10 ENCOUNTER — Encounter: Payer: Self-pay | Admitting: Gastroenterology

## 2024-02-15 ENCOUNTER — Inpatient Hospital Stay: Attending: Hematology & Oncology

## 2024-02-15 ENCOUNTER — Other Ambulatory Visit: Payer: Self-pay | Admitting: Medical Oncology

## 2024-02-15 ENCOUNTER — Encounter: Payer: Self-pay | Admitting: Medical Oncology

## 2024-02-15 ENCOUNTER — Inpatient Hospital Stay: Admitting: Medical Oncology

## 2024-02-15 VITALS — BP 126/68 | HR 83 | Temp 98.7°F | Resp 18 | Ht 72.0 in | Wt 249.4 lb

## 2024-02-15 DIAGNOSIS — D751 Secondary polycythemia: Secondary | ICD-10-CM

## 2024-02-15 DIAGNOSIS — E119 Type 2 diabetes mellitus without complications: Secondary | ICD-10-CM | POA: Insufficient documentation

## 2024-02-15 DIAGNOSIS — Z803 Family history of malignant neoplasm of breast: Secondary | ICD-10-CM | POA: Insufficient documentation

## 2024-02-15 LAB — CMP (CANCER CENTER ONLY)
ALT: 13 U/L (ref 0–44)
AST: 19 U/L (ref 15–41)
Albumin: 4.4 g/dL (ref 3.5–5.0)
Alkaline Phosphatase: 42 U/L (ref 38–126)
Anion gap: 10 (ref 5–15)
BUN: 19 mg/dL (ref 8–23)
CO2: 26 mmol/L (ref 22–32)
Calcium: 9.7 mg/dL (ref 8.9–10.3)
Chloride: 102 mmol/L (ref 98–111)
Creatinine: 1.38 mg/dL — ABNORMAL HIGH (ref 0.61–1.24)
GFR, Estimated: 55 mL/min — ABNORMAL LOW (ref >60)
Glucose, Bld: 93 mg/dL (ref 70–99)
Potassium: 5.1 mmol/L (ref 3.5–5.1)
Sodium: 138 mmol/L (ref 135–145)
Total Bilirubin: 0.6 mg/dL (ref 0.0–1.2)
Total Protein: 7 g/dL (ref 6.5–8.1)

## 2024-02-15 LAB — CBC WITH DIFFERENTIAL (CANCER CENTER ONLY)
Abs Immature Granulocytes: 0.04 K/uL (ref 0.00–0.07)
Basophils Absolute: 0 K/uL (ref 0.0–0.1)
Basophils Relative: 0 %
Eosinophils Absolute: 0.3 K/uL (ref 0.0–0.5)
Eosinophils Relative: 3 %
HCT: 50.2 % (ref 39.0–52.0)
Hemoglobin: 17.2 g/dL — ABNORMAL HIGH (ref 13.0–17.0)
Immature Granulocytes: 1 %
Lymphocytes Relative: 32 %
Lymphs Abs: 2.3 K/uL (ref 0.7–4.0)
MCH: 30.8 pg (ref 26.0–34.0)
MCHC: 34.3 g/dL (ref 30.0–36.0)
MCV: 89.8 fL (ref 80.0–100.0)
Monocytes Absolute: 0.6 K/uL (ref 0.1–1.0)
Monocytes Relative: 8 %
Neutro Abs: 4 K/uL (ref 1.7–7.7)
Neutrophils Relative %: 56 %
Platelet Count: 185 K/uL (ref 150–400)
RBC: 5.59 MIL/uL (ref 4.22–5.81)
RDW: 14.1 % (ref 11.5–15.5)
WBC Count: 7.3 K/uL (ref 4.0–10.5)
nRBC: 0 % (ref 0.0–0.2)

## 2024-02-15 LAB — FERRITIN: Ferritin: 136 ng/mL (ref 24–336)

## 2024-02-15 LAB — IRON AND IRON BINDING CAPACITY (CC-WL,HP ONLY)
Iron: 100 ug/dL (ref 45–182)
Saturation Ratios: 29 % (ref 17.9–39.5)
TIBC: 347 ug/dL (ref 250–450)
UIBC: 247 ug/dL

## 2024-02-15 NOTE — Progress Notes (Signed)
 [facility] Telephone:([phone]) [phone]   Fax:([phone]) [phone]  Office Visit Note  Patient Care Team: Saguier, [place] RIGGERS as PCP - General (Internal Medicine) Delford Maude BROCKS, MD as PCP - Cardiology (Cardiology)  Hematological/Oncological History # None  CHIEF COMPLAINTS/PURPOSE OF CONSULTATION:  Elevated hemoglobin levels.   HISTORY OF PRESENTING ILLNESS:  Jonathon Price 68 y.o. male who was referred to us  by his PCP at suggestion of his Cardiologist for an acute on chronic elevated Hgb. 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 He was referred back to our office for follow up after recent blood work showed a Hgb of 18.7.   Today he states that he has been well to his knowledge. He works to stay well hydrated.   Patient has a complex cardiac history of A. Fib, Mitral regugitation, aortic regurgitation, heart failure and HTN, DM2, hypercholesterolemia.  He does not take any vitamins or supplements.   He denies smoking history but did have some childhood smoke exposure. No known COPD or sleep apnea. He reports having had a sleep apnea test a few years ago which was negative. He reports being heavier at that time than he is now. He denies daytime fatigue, morning headaches or snoring.   He does not use testosterone supplementation. Recent PSA normal.   No recent exposure to significant elevation.   He does not drive an older vehicle. No known or suspected carbon monoxide exposures and has UTD detectors at home and work.   No history of thalassemia. No history of elevated iron levels.   MEDICAL HISTORY:  Past Medical History:  Diagnosis Date   A-fib [facility])    Hypertension     SURGICAL HISTORY: Past Surgical History:  Procedure Laterality Date   right ankle operation  12 31 1978    SOCIAL HISTORY: Social History   Socioeconomic History   Marital status: Married    Spouse name: Not on file   Number of  children: 2   Years of education: Not on file   Highest education level: Not on file  Occupational History   Occupation: Emergency planning/management officer   Tobacco Use   Smoking status: Never   Smokeless tobacco: Never  Vaping Use   Vaping status: Never Used  Substance and Sexual Activity   Alcohol use: No    Alcohol/week: 0.0 standard drinks of alcohol   Drug use: No   Sexual activity: Yes  Other Topics Concern   Not on file  Social History Narrative   Home is [place] , works in [employer] most of the time , travels to different states in an RV   Social Drivers of Corporate investment banker Strain: Low Risk  (01/30/2024)   Overall Financial Resource Strain (CARDIA)    Difficulty of Paying Living Expenses: Not very hard  Food Insecurity: No Food Insecurity (01/30/2024)   Hunger Vital Sign    Worried About Running Out of Food in the Last Year: Never true    Ran Out of Food in the Last Year: Never true  Transportation Needs: Patient Declined (01/30/2024)   PRAPARE - Transportation    Lack of Transportation (Medical): Patient declined    Lack of Transportation (Non-Medical): Patient declined  Physical Activity: Insufficiently Active (01/30/2024)   Exercise Vital Sign    Days of Exercise per Week: 3 days    Minutes of Exercise per Session: 40 min  Stress: No Stress Concern Present (01/30/2024)   [employer] of  Occupational Health - Occupational Stress Questionnaire    Feeling of Stress: Not at all  Social Connections: Moderately Isolated (01/30/2024)   Social Connection and Isolation Panel    Frequency of Communication with Friends and Family: Twice a week    Frequency of Social Gatherings with Friends and Family: Once a week    Attends Religious Services: Patient declined    Database administrator or Organizations: No    Attends Engineer, structural: Not on file    Marital Status: Married  Catering manager Violence: Not At Risk (02/17/2022)   Received from [facility] Safety     Threatened: Not on file    Insulted: Not on file    Physically Hurt : Not on file    Scream: Not on file    FAMILY HISTORY: Family History  Problem Relation Age of Onset   Breast cancer Mother    CAD Neg Hx    Atrial fibrillation Neg Hx    Colon cancer Neg Hx    Prostatitis Neg Hx    Diabetes Neg Hx     ALLERGIES:  has no known allergies.  MEDICATIONS:  Current Outpatient Medications  Medication Sig Dispense Refill   empagliflozin  (JARDIANCE ) 10 MG TABS tablet Take 1 tablet (10 mg total) by mouth daily. 90 tablet 3   metFORMIN  (GLUCOPHAGE ) 500 MG tablet TAKE 1 TABLET BY MOUTH TWICE A DAY WITH FOOD 180 tablet 0   metoprolol  succinate (TOPROL -XL) 50 MG 24 hr tablet Take 1 tablet (50 mg total) by mouth 2 (two) times daily. 180 tablet 3   rivaroxaban  (XARELTO ) 20 MG TABS tablet Take 1 tablet (20 mg total) by mouth daily. 30 tablet 5   sacubitril -valsartan  (ENTRESTO ) 49-51 MG Take 1 tablet by mouth 2 (two) times daily. 180 tablet 3   No current facility-administered medications for this visit.    REVIEW OF SYSTEMS:   Constitutional: ( - ) fevers, ( - )  chills , ( - ) night sweats Eyes: ( - ) blurriness of vision, ( - ) double vision, ( - ) watery eyes Ears, nose, mouth, throat, and face: ( - ) mucositis, ( - ) sore throat Respiratory: ( - ) cough, ( - ) dyspnea, ( - ) wheezes Cardiovascular: ( - ) palpitation, ( - ) chest discomfort, ( - ) lower extremity swelling Gastrointestinal:  ( - ) nausea, ( - ) heartburn, ( - ) change in bowel habits Skin: ( - ) abnormal skin rashes Lymphatics: ( - ) new lymphadenopathy, ( - ) easy bruising Neurological: ( - ) numbness, ( - ) tingling, ( - ) new weaknesses Behavioral/Psych: ( - ) mood change, ( - ) new changes  All other systems were reviewed with the patient and are negative.  PHYSICAL EXAMINATION: ECOG PERFORMANCE STATUS: 1 - Symptomatic but completely ambulatory  Vitals:   02/15/24 1509  BP: 126/68  Pulse: 83  Resp: 18   Temp: 98.7 F (37.1 C)  SpO2: 100%   Filed Weights   02/15/24 1509  Weight: 249 lb 6.4 oz (113.1 kg)    GENERAL: well appearing male in NAD  SKIN: skin color, texture, turgor are normal, no rashes or significant lesions EYES: conjunctiva are pink and non-injected, sclera clear OROPHARYNX: no exudate, no erythema; lips, buccal mucosa, and tongue normal  NECK: supple, non-tender LYMPH:  no palpable lymphadenopathy in the cervical, axillary or supraclavicular lymph nodes.  LUNGS: clear to auscultation and percussion with normal breathing effort  HEART: regular rate & rhythm and no murmurs and no lower extremity edema ABDOMEN: soft, non-tender, non-distended, normal bowel sounds, palpable spleen Musculoskeletal: no cyanosis of digits and no clubbing  PSYCH: alert & oriented x 3, fluent speech NEURO: no focal motor/sensory deficits  LABORATORY DATA:  I have reviewed the data as listed    Latest Ref Rng & Units 02/15/2024    2:49 PM 01/25/2024    8:46 AM 04/08/2023    2:31 PM  CBC  WBC 4.0 - 10.5 K/uL 7.3  6.5  6.6   Hemoglobin 13.0 - 17.0 g/dL 82.7  81.2  82.3   Hematocrit 39.0 - 52.0 % 50.2  55.1  54.2 Repeated and verified X2.   Platelets 150 - 400 K/uL 185  208  195.0        Latest Ref Rng & Units 02/15/2024    2:49 PM 01/31/2024    8:38 AM 01/25/2024    8:46 AM  CMP  Glucose 70 - 99 mg/dL 93  94  94   BUN 8 - 23 mg/dL 19  16  17    Creatinine 0.61 - 1.24 mg/dL 8.61  8.85  8.88   Sodium 135 - 145 mmol/L 138  139  140   Potassium 3.5 - 5.1 mmol/L 5.1  4.4  4.7   Chloride 98 - 111 mmol/L 102  101  103   CO2 22 - 32 mmol/L 26  27  20    Calcium  8.9 - 10.3 mg/dL 9.7  9.5  9.8   Total Protein 6.5 - 8.1 g/dL 7.0  7.0    Total Bilirubin 0.0 - 1.2 mg/dL 0.6  1.1    Alkaline Phos 38 - 126 U/L 42  38    AST 15 - 41 U/L 19  14    ALT 0 - 44 U/L 13  12     ASSESSMENT & PLAN  Jonathon Price is a 68 y.o. male who we follow for polycythemia. He is JAK2 negative. Splenic  measurements on US  normal on 01/14/2023. 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   Again we reviewed that there are two types of polycythemia, Primary polycythemia and secondary polycythemia. Primary polycythemia is overproduction of red blood cells due to a driver mutation. The most common mutation is the JAK2 [id number] (95% of cases), but there are other mutations which can cause this disorder. Primary polycythemia is a myeloproliferative neoplasm which may require cytoreductive therapy to decrease risk of thrombosis. This can consist of medications or phlebotomy to drive down the red blood cell counts. Secondary polycythemia is polycythemia driven by low oxygen levels. This represents an appropriate response of the body attempting to increase red cell volume. Causes of secondary polycythemia include smoking (most common), obstructive sleep apnea (OSA), or living at altitude. This can also be caused by testosterone supplementation. Certain thalassemias can present with marked erythrocytosis , but normal hemoglobin. Secondary polycythemia does not have the same level of thrombotic risk and therefore does not require cytoreductive therapy or phlebotomy.   Review of recent labs showed a Hgb of 18.7 (01/25/2024) which is why he was referred back to us . Suspected to be further elevated due to dehydration. Today his Hgb is 17.2 without blood loss or donation. I have recommended target goal of 1-3 blood donations yearly to help reduce his Hgb. Should there be any concern for COPD I would suggest further work up by PCP. For now we will plan to see him in 1 year for  APP, labs    Lauraine Dais PA-C Department of Hematology/Oncology [facility]

## 2024-03-08 ENCOUNTER — Telehealth: Payer: Self-pay | Admitting: Medical

## 2024-03-08 ENCOUNTER — Telehealth: Payer: Self-pay

## 2024-03-08 MED ORDER — METFORMIN HCL 500 MG PO TABS: 500.0000 mg | ORAL_TABLET | Freq: Two times a day (BID) | ORAL | 0 refills | Status: DC

## 2024-03-08 NOTE — Telephone Encounter (Signed)
 Called pt with no answer to get him scheduled for a AWV  If pt calls back to schedule please put in note section DM STATIN

## 2024-03-08 NOTE — Telephone Encounter (Signed)
 Copied from CRM [id number]. Topic: Clinical - Medication Refill >> Mar 08, 2024 10:53 AM Thersia C wrote: Medication: metFORMIN  (GLUCOPHAGE ) 500 MG tablet  Has the patient contacted their pharmacy? Yes (Agent: If no, request that the patient contact the pharmacy for the refill. If patient does not wish to contact the pharmacy document the reason why and proceed with request.) (Agent: If yes, when and what did the pharmacy advise?)  This is the patient's preferred pharmacy:  [id number] - [place], [place] - [address] [place] [address] Phone: [phone] Fax: [phone]  Is this the correct pharmacy for this prescription? Yes If no, delete pharmacy and type the correct one.   Has the prescription been filled recently? No  Is the patient out of the medication? Yes  Has the patient been seen for an appointment in the last year OR does the patient have an upcoming appointment? Yes  Can we respond through MyChart? Yes  Agent: Please be advised that Rx refills may take up to 3 business days. We ask that you follow-up with your pharmacy.

## 2024-03-22 ENCOUNTER — Ambulatory Visit: Admitting: Gastroenterology

## 2024-03-22 ENCOUNTER — Encounter: Payer: Self-pay | Admitting: Gastroenterology

## 2024-03-22 ENCOUNTER — Telehealth: Payer: Self-pay

## 2024-03-22 VITALS — BP 122/84 | HR 88 | Ht 70.75 in | Wt 246.1 lb

## 2024-03-22 DIAGNOSIS — Z1211 Encounter for screening for malignant neoplasm of colon: Secondary | ICD-10-CM

## 2024-03-22 DIAGNOSIS — I4821 Permanent atrial fibrillation: Secondary | ICD-10-CM

## 2024-03-22 DIAGNOSIS — R194 Change in bowel habit: Secondary | ICD-10-CM

## 2024-03-22 MED ORDER — NA SULFATE-K SULFATE-MG SULF 17.5-3.13-1.6 GM/177ML PO SOLN: 1.0000 | Freq: Once | ORAL | 0 refills | Status: DC

## 2024-03-22 NOTE — Telephone Encounter (Signed)
 [facility] Pre-operative Risk Assessment     Request for surgical clearance:     Endoscopy Procedure  What type of surgery is being performed?     Endoscopic  When is this surgery scheduled?     03/30/24  What type of clearance is required ?   Pharmacy  Are there any medications that need to be held prior to surgery and how long? Xarelto  2 days  Practice name and name of physician performing surgery?      [facility]  What is your office phone and fax number?      Phone- [phone]  Fax- [phone]  Anesthesia type (None, local, MAC, general) ?       MAC   Please route your response to Karna Louder, RMA

## 2024-03-22 NOTE — Patient Instructions (Signed)
 You have been scheduled for a colonoscopy. Please follow written instructions given to you at your visit today.   If you use inhalers (even only as needed), please bring them with you on the day of your procedure.  DO NOT TAKE 7 DAYS PRIOR TO TEST- Trulicity (dulaglutide) Ozempic, Wegovy (semaglutide) Mounjaro (tirzepatide) Bydureon Bcise (exanatide extended release)  DO NOT TAKE 1 DAY PRIOR TO YOUR TEST Rybelsus (semaglutide) Adlyxin (lixisenatide) Victoza (liraglutide) Byetta (exanatide) ___________________________________________________________________________  Due to recent changes in healthcare laws, you may see the results of your imaging and laboratory studies on MyChart before your provider has had a chance to review them.  We understand that in some cases there may be results that are confusing or concerning to you. Not all laboratory results come back in the same time frame and the provider may be waiting for multiple results in order to interpret others.  Please give us  48 hours in order for your provider to thoroughly review all the results before contacting the office for clarification of your results.   _______________________________________________________  If your blood pressure at your visit was 140/90 or greater, please contact your primary care physician to follow up on this.  _______________________________________________________  If you are age 70 or older, your body mass index should be between 23-30. Your Body mass index is 34.57 kg/m. If this is out of the aforementioned range listed, please consider follow up with your Primary Care Provider.  If you are age 70 or younger, your body mass index should be between 19-25. Your Body mass index is 34.57 kg/m. If this is out of the aformentioned range listed, please consider follow up with your Primary Care Provider.   ________________________________________________________  The [facility] providers would like to  encourage you to use MYCHART to communicate with providers for non-urgent requests or questions.  Due to long hold times on the telephone, sending your provider a message by [facility] may be a faster and more efficient way to get a response.  Please allow 48 business hours for a response.  Please remember that this is for non-urgent requests.  _______________________________________________________  [facility] is using a team-based approach to care.  Your team is made up of your doctor and two to three APPS. Our APPS (Nurse Practitioners and Physician Assistants) work with your physician to ensure care continuity for you. They are fully qualified to address your health concerns and develop a treatment plan. They communicate directly with your gastroenterologist to care for you. Seeing the Advanced Practice Practitioners on your physician's team can help you by facilitating care more promptly, often allowing for earlier appointments, access to diagnostic testing, procedures, and other specialty referrals.   Thank you for trusting me with your gastrointestinal care. Deanna May, FNP-C

## 2024-03-22 NOTE — Progress Notes (Signed)
 Chief Complaint: Screening for colon cancer Primary GI Doctor: Dr. Stacia  HPI:  Patient is a  68  year old male patient with past medical history of atrial fibrillation, hypertension, and DM2, who was referred to me by Dorina Loving, PA-C on 02/02/2024 for a evaluation of screening for colon cancer.    02/15/2024 patient seen by hematology for elevated hemoglobin, reviewed entire note.  Interval History    Patient presents for evaluation for colon screening colonoscopy. Patient reports up until the last couple of months he did not have altered bowel habits. He recently had a bout of diarrhea and so did his wife that lasted a few weeks. He reports over the last two weeks it has improved. No abdominal pain or blood in stool. No nausea or vomiting.  Patient denies GERD or dysphagia  Patient on Xarelto  20 mg po daily.   Cologuard was positive in 2019. Never had colonoscopy.   Patient's family history : no colon CA, no esophageal CA  Wt Readings from Last 3 Encounters:  03/22/24 246 lb 2 oz (111.6 kg)  02/15/24 249 lb 6.4 oz (113.1 kg)  01/31/24 247 lb (112 kg)      Past Medical History:  Diagnosis Date   A-fib (HCC)    DM (diabetes mellitus) (HCC)    Hypertension     Past Surgical History:  Procedure Laterality Date   right ankle operation  07/04/1977    Current Outpatient Medications  Medication Sig Dispense Refill   empagliflozin  (JARDIANCE ) 10 MG TABS tablet Take 1 tablet (10 mg total) by mouth daily. 90 tablet 3   metFORMIN  (GLUCOPHAGE ) 500 MG tablet Take 1 tablet (500 mg total) by mouth 2 (two) times daily with a meal. 180 tablet 0   metoprolol  succinate (TOPROL -XL) 50 MG 24 hr tablet Take 1 tablet (50 mg total) by mouth 2 (two) times daily. 180 tablet 3   rivaroxaban  (XARELTO ) 20 MG TABS tablet Take 1 tablet (20 mg total) by mouth daily. 30 tablet 5   sacubitril -valsartan  (ENTRESTO ) 49-51 MG Take 1 tablet by mouth 2 (two) times daily. 180 tablet 3   No current  facility-administered medications for this visit.    Allergies as of 03/22/2024   (No Known Allergies)    Family History  Problem Relation Age of Onset   Breast cancer Mother    Arthritis Sister    Arthritis Sister    Heart disease Sister    Alcoholism Brother    CAD Neg Hx    Atrial fibrillation Neg Hx    Colon cancer Neg Hx    Prostatitis Neg Hx    Diabetes Neg Hx     Review of Systems:    Constitutional: No weight loss, fever, chills, weakness or fatigue HEENT: Eyes: No change in vision               Ears, Nose, Throat:  No change in hearing or congestion Skin: No rash or itching Cardiovascular: No chest pain, chest pressure or palpitations   Respiratory: No SOB or cough Gastrointestinal: See HPI and otherwise negative Genitourinary: No dysuria or change in urinary frequency Neurological: No headache, dizziness or syncope Musculoskeletal: No new muscle or joint pain Hematologic: No bleeding or bruising Psychiatric: No history of depression or anxiety    Physical Exam:  Vital signs: BP 122/84 (BP Location: Left Arm, Patient Position: Sitting, Cuff Size: Large)   Pulse 88 Comment: irregular  Ht 5' 10.75 (1.797 m) Comment: height measured without shoes  Wt 246 lb 2 oz (111.6 kg)   BMI 34.57 kg/m   Constitutional:   Pleasant  male appears to be in NAD, Well developed, Well nourished, alert and cooperative Throat: Oral cavity and pharynx without inflammation, swelling or lesion.  Respiratory: Respirations even and unlabored. Lungs clear to auscultation bilaterally.   No wheezes, crackles, or rhonchi.  Cardiovascular: Normal S1, S2. Regular rate and rhythm. No peripheral edema, cyanosis or pallor.  Gastrointestinal:  Soft, nondistended, nontender. No rebound or guarding. Normal bowel sounds. No appreciable masses or hepatomegaly. Rectal:  Not performed.  Msk:  Symmetrical without gross deformities. Without edema, no deformity or joint abnormality.  Neurologic:  Alert  and  oriented x4;  grossly normal neurologically.  Skin:   Dry and intact without significant lesions or rashes.  RELEVANT LABS AND IMAGING: CBC    Latest Ref Rng & Units 02/15/2024    2:49 PM 01/25/2024    8:46 AM 04/08/2023    2:31 PM  CBC  WBC 4.0 - 10.5 K/uL 7.3  6.5  6.6   Hemoglobin 13.0 - 17.0 g/dL 82.7  81.2  82.3   Hematocrit 39.0 - 52.0 % 50.2  55.1  54.2 Repeated and verified X2.   Platelets 150 - 400 K/uL 185  208  195.0      CMP     Latest Ref Rng & Units 02/15/2024    2:49 PM 01/31/2024    8:38 AM 01/25/2024    8:46 AM  CMP  Glucose 70 - 99 mg/dL 93  94  94   BUN 8 - 23 mg/dL 19  16  17    Creatinine 0.61 - 1.24 mg/dL 8.61  8.85  8.88   Sodium 135 - 145 mmol/L 138  139  140   Potassium 3.5 - 5.1 mmol/L 5.1  4.4  4.7   Chloride 98 - 111 mmol/L 102  101  103   CO2 22 - 32 mmol/L 26  27  20    Calcium  8.9 - 10.3 mg/dL 9.7  9.5  9.8   Total Protein 6.5 - 8.1 g/dL 7.0  7.0    Total Bilirubin 0.0 - 1.2 mg/dL 0.6  1.1    Alkaline Phos 38 - 126 U/L 42  38    AST 15 - 41 U/L 19  14    ALT 0 - 44 U/L 13  12       Lab Results  Component Value Date   TSH 0.39 02/15/2017  11/2023 echo-Left ventricular ejection fraction, by estimation, is 45 to 50%.   Assessment: Encounter Diagnoses  Name Primary?   Special screening for malignant neoplasms, colon Yes   Permanent atrial fibrillation (HCC)    Altered bowel habits   #3  68 year old male patient who presents for initial colon screening colonoscopy, will schedule in [facility] with Dr. Stacia. He will need cardiac clearance for the Xarelto . If he sees any areas of inflammation can take random biopsies to r/o colitis. I suspect he had enteric infection that sense then has resolved.  #2 Atrial fib -on xarelto   Plan: -Schedule for a colonoscopy in [facility] with Dr. Stacia. The risks and benefits of colonoscopy with possible polypectomy / biopsies were discussed and the patient agrees to proceed. -Will need Xarelto  clearance from  cardiologist Dr. Maude Emmer   Thank you for the courtesy of this consult. Please call me with any questions or concerns.   Calandra Madura, FNP-C [facility] 03/22/2024, 8:48 PM  Cc: Saguier, Edward,  PA-C

## 2024-03-23 NOTE — Telephone Encounter (Signed)
   Patient Name: Jonathon Price  DOB: Dec 18, 1955 MRN: [id number]  Primary Cardiologist: Maude Emmer, MD  Chart reviewed as part of pre-operative protocol coverage. Pre-op clearance already addressed by colleagues in earlier phone notes. To summarize recommendations:  -Patient has not  had an Afib/aflutter ablation or Watchman within the last 3 months or DCCV within the last 30 days. Per office protocol, patient can hold Xarelto   for 2 days prior to procedure.  Please resume when medically safe to do so.  Medical clearance was not requested.    Will route this bundled recommendation to requesting provider via Epic fax function and remove from pre-op pool. Please call with questions.  Jonathon LOISE Fabry, PA-C 03/23/2024, 8:39 AM

## 2024-03-23 NOTE — Telephone Encounter (Signed)
 Patient with diagnosis of Afib on Xarelto  for anticoagulation.    Procedure: Endoscopic  Date of procedure: 03/30/2024   CHA2DS2-VASc Score = 4   This indicates a 4.8% annual risk of stroke. The patient's score is based upon: CHF History: 1 HTN History: 1 Diabetes History: 1 Stroke History: 0 Vascular Disease History: 0 Age Score: 1 Gender Score: 0    CrCl 64 mL/min  Platelet count 185 K  Patient has not  had an Afib/aflutter ablation or Watchman within the last 3 months or DCCV within the last 30 days     Per office protocol, patient can hold Xarelto   for 2 days prior to procedure.     **This guidance is not considered finalized until pre-operative APP has relayed final recommendations.**

## 2024-03-30 ENCOUNTER — Encounter: Admitting: Gastroenterology

## 2024-04-02 NOTE — Progress Notes (Signed)
 Agree with the assessment and plan as outlined by [facility], FNP-C.    Ginni Eichler E. Tomasa Rand, MD [facility]

## 2024-04-05 ENCOUNTER — Encounter: Payer: Self-pay | Admitting: Cardiovascular Disease

## 2024-04-10 ENCOUNTER — Ambulatory Visit (HOSPITAL_COMMUNITY)
Admission: RE | Admit: 2024-04-10 | Discharge: 2024-04-10 | Disposition: A | Discharge status: Home / Self Care | Payer: Self-pay | Source: Ambulatory Visit | Attending: Student in an Organized Health Care Education/Training Program | Admitting: Student in an Organized Health Care Education/Training Program

## 2024-04-10 DIAGNOSIS — I429 Cardiomyopathy, unspecified: Secondary | ICD-10-CM | POA: Insufficient documentation

## 2024-06-01 ENCOUNTER — Other Ambulatory Visit: Payer: Self-pay | Admitting: Medical

## 2024-07-10 ENCOUNTER — Telehealth: Payer: Self-pay | Admitting: Medical

## 2024-07-10 NOTE — Telephone Encounter (Signed)
 Copied from CRM [id number]. Topic: Medicare AWV >> Jul 10, 2024  9:49 AM Nathanel DEL wrote: Called LVM 07/10/2024 to sched AWVI. Please schedule AWVI in office.   Nathanel Paschal; Care Guide Ambulatory Clinical Support  l [facility] Direct Dial: [phone]

## 2025-02-14 ENCOUNTER — Ambulatory Visit: Admitting: Medical Oncology

## 2025-02-14 ENCOUNTER — Inpatient Hospital Stay
